# Patient Record
Sex: Male | Born: 1945 | Race: White | Hispanic: No | Marital: Married | Smoking: Former smoker
Health system: Southern US, Community
[De-identification: ages and names within clinical notes are randomized; demographics above are authoritative.]

## PROBLEM LIST (undated history)

## (undated) DIAGNOSIS — R002 Palpitations: Secondary | ICD-10-CM

## (undated) DIAGNOSIS — M1712 Unilateral primary osteoarthritis, left knee: Secondary | ICD-10-CM

## (undated) DIAGNOSIS — M25569 Pain in unspecified knee: Secondary | ICD-10-CM

## (undated) HISTORY — DX: Unilateral primary osteoarthritis, left knee: M17.12

## (undated) HISTORY — PX: HERNIA REPAIR: SHX51

## (undated) HISTORY — DX: Pain in unspecified knee: M25.569

## (undated) HISTORY — DX: Palpitations: R00.2

## (undated) HISTORY — PX: CATARACT EXTRACTION, BILATERAL: SHX1313

---

## 2020-05-23 ENCOUNTER — Emergency Department (HOSPITAL_BASED_OUTPATIENT_CLINIC_OR_DEPARTMENT_OTHER)
Admission: EM | Admit: 2020-05-23 | Discharge: 2020-05-23 | Disposition: A | Discharge status: Home / Self Care | Payer: Medicare Other | Attending: Emergency Medicine | Admitting: Emergency Medicine

## 2020-05-23 ENCOUNTER — Encounter (HOSPITAL_BASED_OUTPATIENT_CLINIC_OR_DEPARTMENT_OTHER): Payer: Self-pay | Admitting: Emergency Medicine

## 2020-05-23 ENCOUNTER — Emergency Department (HOSPITAL_BASED_OUTPATIENT_CLINIC_OR_DEPARTMENT_OTHER): Payer: Medicare Other

## 2020-05-23 ENCOUNTER — Other Ambulatory Visit: Payer: Self-pay

## 2020-05-23 DIAGNOSIS — R002 Palpitations: Secondary | ICD-10-CM | POA: Insufficient documentation

## 2020-05-23 LAB — CBC
HCT: 47.6 % (ref 39.0–52.0)
Hemoglobin: 16.7 g/dL (ref 13.0–17.0)
MCH: 31.6 pg (ref 26.0–34.0)
MCHC: 35.1 g/dL (ref 30.0–36.0)
MCV: 90 fL (ref 80.0–100.0)
Platelets: 196 10*3/uL (ref 150–400)
RBC: 5.29 MIL/uL (ref 4.22–5.81)
RDW: 12.5 % (ref 11.5–15.5)
WBC: 6.2 10*3/uL (ref 4.0–10.5)
nRBC: 0 % (ref 0.0–0.2)

## 2020-05-23 LAB — BASIC METABOLIC PANEL
Anion gap: 11 (ref 5–15)
BUN: 13 mg/dL (ref 8–23)
CO2: 24 mmol/L (ref 22–32)
Calcium: 9.6 mg/dL (ref 8.9–10.3)
Chloride: 104 mmol/L (ref 98–111)
Creatinine, Ser: 0.99 mg/dL (ref 0.61–1.24)
GFR, Estimated: >60 mL/min (ref >60)
Glucose, Bld: 127 mg/dL — ABNORMAL HIGH (ref 70–99)
Potassium: 3.7 mmol/L (ref 3.5–5.1)
Sodium: 139 mmol/L (ref 135–145)

## 2020-05-23 LAB — TSH: TSH: 1.819 u[IU]/mL (ref 0.350–4.500)

## 2020-05-23 LAB — TROPONIN I (HIGH SENSITIVITY): Troponin I (High Sensitivity): 6 ng/L (ref <18)

## 2020-05-23 NOTE — ED Triage Notes (Signed)
Pt woke up this am and had heart racing for in 160s according to monitor at home and took BP and it was 181/96. Denies HTN or cardiac disease. States it has resolved now. Denies chest pain during event, lasted 1 hour. Took 81 mg ASA during event.

## 2020-05-23 NOTE — ED Provider Notes (Signed)
[facility] EMERGENCY DEPARTMENT Provider Note   CSN: [id number] Arrival date & time: 05/23/20  1027     History Chief Complaint  Patient presents with   Palpitations    Zachary Barton is a 74 y.o. male.  HPI Patient presents with a racing heart.  States he woke up this morning to feel his heart going fast.  States it was around 150 260 beats a minute.  Lasted about an hour but is now resolved.  Took a baby aspirin.  No chest pain.  No lightheadedness.  Has been doing well the last few days.  Is not on any medicines.  Recently able to hike [employer].  No swelling in his legs.  No shortness of breath.  No stimulants.  No history of thyroid disease.    History reviewed. No pertinent past medical history.  There are no problems to display for this patient.   History reviewed. No pertinent surgical history.     No family history on file.  Social History   Tobacco Use   Smoking status: Never Smoker   Smokeless tobacco: Never Used  Substance Use Topics   Alcohol use: Never   Drug use: Never    Home Medications Prior to Admission medications   Medication Sig Start Date End Date Taking? Authorizing Provider  Glucosamine Sulfate 1000 MG TABS Take by mouth.    [provider]    Allergies    Patient has no known allergies.  Review of Systems   Review of Systems  Constitutional: Negative for appetite change.  HENT: Negative for congestion.   Respiratory: Negative for shortness of breath.   Cardiovascular: Positive for palpitations. Negative for leg swelling.  Gastrointestinal: Negative for abdominal pain.  Genitourinary: Negative for flank pain.  Musculoskeletal: Negative for back pain.  Skin: Negative for rash.  Neurological: Negative for weakness.  Psychiatric/Behavioral: Negative for confusion.    Physical Exam Updated Vital Signs BP (!) 184/101    Pulse 76    Temp 98.4 F (36.9 C)    Resp 16    Ht 5\' 10"  (1.778 m)    Wt 83.9  kg    SpO2 99%    BMI 26.54 kg/m   Physical Exam Vitals and nursing note reviewed.  HENT:     Head: Normocephalic.  Eyes:     Pupils: Pupils are equal, round, and reactive to light.  Cardiovascular:     Rate and Rhythm: Normal rate and regular rhythm.  Pulmonary:     Breath sounds: No wheezing, rhonchi or rales.  Abdominal:     Tenderness: There is no abdominal tenderness.  Musculoskeletal:        General: No tenderness.     Cervical back: Neck supple.     Right lower leg: No edema.     Left lower leg: No edema.  Skin:    General: Skin is warm.     Capillary Refill: Capillary refill takes less than 2 seconds.  Neurological:     Mental Status: He is alert and oriented to person, place, and time.     ED Results / Procedures / Treatments   Labs (all labs ordered are listed, but only abnormal results are displayed) Labs Reviewed  BASIC METABOLIC PANEL - Abnormal; Notable for the following components:      Result Value   Glucose, Bld 127 (*)    All other components within normal limits  CBC  TSH  TROPONIN I (HIGH SENSITIVITY)    EKG  EKG Interpretation  Date/Time:  Friday May 23 2020 10:42:41 EST Ventricular Rate:  97 PR Interval:    QRS Duration: 105 QT Interval:  363 QTC Calculation: 462 R Axis:   76 Text Interpretation: Sinus rhythm Borderline repolarization abnormality Confirmed by Benjiman Core [phone]) on 05/23/2020 10:44:05 AM   Radiology DG Chest 2 View  Result Date: 05/23/2020 CLINICAL DATA:  Chest pain and tachycardia this morning. EXAM: CHEST - 2 VIEW COMPARISON:  None. FINDINGS: The cardiac silhouette, mediastinal and hilar contours are normal. The lungs are clear. No pleural effusion. No pulmonary lesions. The bony thorax is intact. IMPRESSION: No acute cardiopulmonary findings. Electronically Signed   By: Rudie Meyer M.D.   On: 05/23/2020 11:15    Procedures Procedures (including critical care time)  Medications Ordered in  ED Medications - No data to display  ED Course  I have reviewed the triage vital signs and the nursing notes.  Pertinent labs & imaging results that were available during my care of the patient were reviewed by me and considered in my medical decision making (see chart for details).    MDM Rules/Calculators/A&P                          Patient woke this morning feeling his heart race.  Last about an hour.  Heart rates up to 150 160.  It has since resolved.  Lab work reassuring.  TSH done but still pending.  Blood pressure elevated here but states he does have whitecoat syndrome.  Does not have a PCP but will need to follow-up.  Also given resources for cardiology.  Discussed with patient and his wife about what to watch for in terms of persistently fast heart rate.  Patient's wife sees [facility] physicians.  He may follow with them or may follow with our providers.  Will discharge home. With a heart rate at around 150 potentially could be in atrial fibrillation but was not caught on monitoring.  Do not think he has criteria for anticoagulation at this point and his rate appears controlled as is his rhythm at this time. Final Clinical Impression(s) / ED Diagnoses Final diagnoses:  Palpitations    Rx / DC Orders ED Discharge Orders    None       Benjiman Core, MD 05/23/20 1158

## 2020-05-23 NOTE — Discharge Instructions (Addendum)
Follow up with cardiology and primary care doctor

## 2020-05-27 ENCOUNTER — Other Ambulatory Visit: Payer: Self-pay

## 2020-05-27 DIAGNOSIS — M25569 Pain in unspecified knee: Secondary | ICD-10-CM | POA: Insufficient documentation

## 2020-05-27 DIAGNOSIS — M1712 Unilateral primary osteoarthritis, left knee: Secondary | ICD-10-CM | POA: Insufficient documentation

## 2020-05-27 DIAGNOSIS — R002 Palpitations: Secondary | ICD-10-CM | POA: Insufficient documentation

## 2020-05-28 ENCOUNTER — Ambulatory Visit: Payer: Medicare Other | Admitting: Cardiology

## 2020-05-28 ENCOUNTER — Other Ambulatory Visit: Payer: Self-pay

## 2020-05-28 ENCOUNTER — Encounter: Payer: Self-pay | Admitting: Cardiology

## 2020-05-28 ENCOUNTER — Ambulatory Visit (INDEPENDENT_AMBULATORY_CARE_PROVIDER_SITE_OTHER): Payer: Medicare Other

## 2020-05-28 VITALS — BP 180/98 | HR 68 | Ht 70.0 in | Wt 189.0 lb

## 2020-05-28 DIAGNOSIS — R002 Palpitations: Secondary | ICD-10-CM

## 2020-05-28 DIAGNOSIS — I10 Essential (primary) hypertension: Secondary | ICD-10-CM

## 2020-05-28 DIAGNOSIS — R0600 Dyspnea, unspecified: Secondary | ICD-10-CM

## 2020-05-28 DIAGNOSIS — R0609 Other forms of dyspnea: Secondary | ICD-10-CM

## 2020-05-28 HISTORY — DX: Dyspnea, unspecified: R06.00

## 2020-05-28 HISTORY — DX: Other forms of dyspnea: R06.09

## 2020-05-28 HISTORY — DX: Essential (primary) hypertension: I10

## 2020-05-28 NOTE — Progress Notes (Signed)
Cardiology Consultation:    Date:  05/28/2020   ID:  Zachary Barton, DOB 1946-02-15, MRN [id number]  PCP:  Patient, No Pcp Per  Cardiologist:  Gypsy Balsam, MD   Referring MD: Benjiman Core, MD   Chief Complaint  Patient presents with  . Palpitations  . Hypertension    History of Present Illness:    Zachary Barton is a 74 y.o. male who is being seen today for the evaluation of I have palpitations at the request of Benjiman Core, MD.  Past medical history significant for questionable essential hypertension, palpitations.  He was sent to [place] to be evaluated for recent episode of palpitations.  He tells me that for about 40 years he experienced some most likely skipped beats.  He said that he can feel his stomach turns around.  However just few days ago he woke up in the middle of the night with heart speeding up between 1 50-1 80 did not have any dizziness that did not sweat did not have any shortness of breath did not have any chest pain he did not feel like he is going to pass out he took some aspirin eventually ended up going to the emergency room.  He showed up in the emergency about 1 hour later.  At that time he was found to be in sinus tachycardia he also have been noted to have elevated blood pressure.  He does not have diagnosis of hypertension but he does me anytime he goes to doctor's office and blood pressure will be elevated.  He does have blood pressure monitor at home but this is the wrist blood pressure monitor and he said when he check blood pressure usually 140 systolic.  Has never had any heart trouble.  Denies have any chest pain tightness squeezing pressure burning chest.  Still trying to be very active and exercise on the regular basis.  He exercise 4-5 times a week for about half an hour to 45 minutes does do some weightlifting as well as some walking with weights and he is ankles. Smoke long time ago for about 5 years when he was a teenager. Does not have  family history of premature coronary disease Does not know what his cholesterol is.  Past Medical History:  Diagnosis Date  . Knee pain   . Osteoarthritis of left patellofemoral joint   . Palpitations     Past Surgical History:  Procedure Laterality Date  . CATARACT EXTRACTION, BILATERAL  2years ago  . HERNIA REPAIR  25 years ago    Current Medications: Current Meds  Medication Sig  . aspirin 81 MG EC tablet Take 81 mg by mouth every other day.  . Glucosamine Sulfate 1000 MG TABS Take by mouth.     Allergies:   Patient has no known allergies.   Social History   Socioeconomic History  . Marital status: Married    Spouse name: Not on file  . Number of children: Not on file  . Years of education: Not on file  . Highest education level: Not on file  Occupational History  . Not on file  Tobacco Use  . Smoking status: Former Games developer  . Smokeless tobacco: Never Used  Substance and Sexual Activity  . Alcohol use: Never  . Drug use: Never  . Sexual activity: Not on file  Other Topics Concern  . Not on file  Social History Narrative  . Not on file   Social Determinants of Health   Financial Resource Strain:   .  Difficulty of Paying Living Expenses: Not on file  Food Insecurity:   . Worried About Programme researcher, broadcasting/film/video in the Last Year: Not on file  . Ran Out of Food in the Last Year: Not on file  Transportation Needs:   . Lack of Transportation (Medical): Not on file  . Lack of Transportation (Non-Medical): Not on file  Physical Activity:   . Days of Exercise per Week: Not on file  . Minutes of Exercise per Session: Not on file  Stress:   . Feeling of Stress : Not on file  Social Connections:   . Frequency of Communication with Friends and Family: Not on file  . Frequency of Social Gatherings with Friends and Family: Not on file  . Attends Religious Services: Not on file  . Active Member of Clubs or Organizations: Not on file  . Attends Banker  Meetings: Not on file  . Marital Status: Not on file     Family History: The patient's family history includes Atrial fibrillation in his brother; Cancer in his mother and paternal grandfather; Cancer - Other in his brother and father; Diabetes in his sister; Heart attack in his brother and maternal grandfather; Hypertension in his mother; Thyroid disease in his mother. ROS:   Please see the history of present illness.    All 14 point review of systems negative except as described per history of present illness.  EKGs/Labs/Other Studies Reviewed:    The following studies were reviewed today: EKG reviewed from the hospital showed normal sinus rhythm normal P interval nonspecific ST segment changes  EKG:  EKG is  ordered today.  The ekg ordered today demonstrates normal sinus rhythm normal P interval normal QS complex duration morphology nonspecific ST segment changes  Recent Labs: 05/23/2020: BUN 13; Creatinine, Ser 0.99; Hemoglobin 16.7; Platelets 196; Potassium 3.7; Sodium 139; TSH 1.819  Recent Lipid Panel No results found for: CHOL, TRIG, HDL, CHOLHDL, VLDL, LDLCALC, LDLDIRECT  Physical Exam:    VS:  BP (!) 180/98 (BP Location: Right Arm, Patient Position: Sitting)   Pulse 68   Ht 5\' 10"  (1.778 m)   Wt 189 lb (85.7 kg)   SpO2 97%   BMI 27.12 kg/m     Wt Readings from Last 3 Encounters:  05/28/20 189 lb (85.7 kg)  05/23/20 185 lb (83.9 kg)     GEN:  Well nourished, well developed in no acute distress HEENT: Normal NECK: No JVD; No carotid bruits LYMPHATICS: No lymphadenopathy CARDIAC: RRR, soft systolic murmur grade 1/6 best at right upper portion of the sternum, no rubs, no gallops RESPIRATORY:  Clear to auscultation without rales, wheezing or rhonchi  ABDOMEN: Soft, non-tender, non-distended MUSCULOSKELETAL:  No edema; No deformity  SKIN: Warm and dry NEUROLOGIC:  Alert and oriented x 3 PSYCHIATRIC:  Normal affect   ASSESSMENT:    1. Palpitations   2. Essential  hypertension   3. Dyspnea on exertion    PLAN:    In order of problems listed above:  1. Palpitations: I will ask him to wear Zio patch for a week hopefully will be able to identified what arrhythmia with dealing with.  Obviously concern is about potential atrial fibrillation.  I suspect he does have essential hypertension, also his wife tells me that he snores a lot sometimes stops breathing at night I suspect sleep apnea also is a diagnosis that he has.  All testing make him more like to have atrial fibrillation.  Luckily his chads 2  Vascor will be only 2.  But still if this is atrial fibrillation anticoagulation should be considered.  As a part of evaluation echocardiogram will be done to assess left ventricle ejection fraction left atrial size. 2. Essential hypertension ask him to purchase blood pressure monitor that he will check blood pressure on his arm, I asked him to check blood pressure on the regular basis once to twice a day and taken out of it.  I want you to bring it in about next 2 weeks or next time when I see him.  I suspect we will start treating for essential hypertension.  He does not have evidence of LVH on the EKG.  I will do echocardiogram to look for evidence of LVH on the echo. 3. Dyspnea on exertion: Echocardiogram will be done to check left ventricle ejection fraction as well as look at diastolic function. 4. Cholesterol status: Unknown.  We will schedule him to have fasting profile. 5. I suspect he does have sleep apnea I initiated conversation about potentially having sleep study done he is reluctant to consider this yet but this is discussion will continue. 6. Systolic heart murmur suspect aortic stenosis but only very soft.  We will schedule him to have an echocardiogram   Medication Adjustments/Labs and Tests Ordered: Current medicines are reviewed at length with the patient today.  Concerns regarding medicines are outlined above.  Orders Placed This Encounter    Procedures  . EKG 12-Lead   No orders of the defined types were placed in this encounter.   Signed, Georgeanna Lea, MD, [facility]. 05/28/2020 10:18 AM    [facility]

## 2020-05-28 NOTE — Patient Instructions (Signed)
Medication Instructions:  Your physician recommends that you continue on your current medications as directed. Please refer to the Current Medication list given to you today. *If you need a refill on your cardiac medications before your next appointment, please call your pharmacy*   Lab Work: Your physician recommends that you return for lab work today: lipid   If you have labs (blood work) drawn today and your tests are completely normal, you will receive your results only by: . MyChart Message (if you have MyChart) OR . A paper copy in the mail If you have any lab test that is abnormal or we need to change your treatment, we will call you to review the results.   Testing/Procedures: Your physician has requested that you have an echocardiogram. Echocardiography is a painless test that uses sound waves to create images of your heart. It provides your doctor with information about the size and shape of your heart and how well your heart's chambers and valves are working. This procedure takes approximately one hour. There are no restrictions for this procedure.  A zio monitor was ordered today. It will remain on for 7 days. You will then return monitor and event diary in provided box. It takes 1-2 weeks for report to be downloaded and returned to us. We will call you with the results. If monitor falls off or has orange flashing light, please call Zio for further instructions.      Follow-Up: At [facility], you and your health needs are our priority.  As part of our continuing mission to provide you with exceptional heart care, we have created designated Provider Care Teams.  These Care Teams include your primary Cardiologist (physician) and Advanced Practice Providers (APPs -  Physician Assistants and Nurse Practitioners) who all work together to provide you with the care you need, when you need it.  We recommend signing up for the patient portal called "MyChart".  Sign up information is  provided on this After Visit Summary.  MyChart is used to connect with patients for Virtual Visits (Telemedicine).  Patients are able to view lab/test results, encounter notes, upcoming appointments, etc.  Non-urgent messages can be sent to your provider as well.   To learn more about what you can do with MyChart, go to [email or web address].    Your next appointment:   6 week(s)  The format for your next appointment:   In Person  Provider:   Robert Krasowski, MD   Other Instructions   Echocardiogram An echocardiogram is a procedure that uses painless sound waves (ultrasound) to produce an image of the heart. Images from an echocardiogram can provide important information about:  Signs of coronary artery disease (CAD).  Aneurysm detection. An aneurysm is a weak or damaged part of an artery wall that bulges out from the normal force of blood pumping through the body.  Heart size and shape. Changes in the size or shape of the heart can be associated with certain conditions, including heart failure, aneurysm, and CAD.  Heart muscle function.  Heart valve function.  Signs of a past heart attack.  Fluid buildup around the heart.  Thickening of the heart muscle.  A tumor or infectious growth around the heart valves. Tell a health care provider about:  Any allergies you have.  All medicines you are taking, including vitamins, herbs, eye drops, creams, and over-the-counter medicines.  Any blood disorders you have.  Any surgeries you have had.  Any medical conditions you have.  Whether you   are pregnant or may be pregnant. What are the risks? Generally, this is a safe procedure. However, problems may occur, including:  Allergic reaction to dye (contrast) that may be used during the procedure. What happens before the procedure? No specific preparation is needed. You may eat and drink normally. What happens during the procedure?   An IV tube may be inserted into one of  your veins.  You may receive contrast through this tube. A contrast is an injection that improves the quality of the pictures from your heart.  A gel will be applied to your chest.  A wand-like tool (transducer) will be moved over your chest. The gel will help to transmit the sound waves from the transducer.  The sound waves will harmlessly bounce off of your heart to allow the heart images to be captured in real-time motion. The images will be recorded on a computer. The procedure may vary among health care providers and hospitals. What happens after the procedure?  You may return to your normal, everyday life, including diet, activities, and medicines, unless your health care provider tells you not to do that. Summary  An echocardiogram is a procedure that uses painless sound waves (ultrasound) to produce an image of the heart.  Images from an echocardiogram can provide important information about the size and shape of your heart, heart muscle function, heart valve function, and fluid buildup around your heart.  You do not need to do anything to prepare before this procedure. You may eat and drink normally.  After the echocardiogram is completed, you may return to your normal, everyday life, unless your health care provider tells you not to do that. This information is not intended to replace advice given to you by your health care provider. Make sure you discuss any questions you have with your health care provider. Document Revised: 10/19/2018 Document Reviewed: 07/31/2016 Elsevier Patient Education  2020 [employer].

## 2020-05-29 ENCOUNTER — Telehealth: Payer: Self-pay | Admitting: Emergency Medicine

## 2020-05-29 DIAGNOSIS — E7800 Pure hypercholesterolemia, unspecified: Secondary | ICD-10-CM

## 2020-05-29 LAB — LIPID PANEL
Chol/HDL Ratio: 5.3 ratio — ABNORMAL HIGH (ref 0.0–5.0)
Cholesterol, Total: 272 mg/dL — ABNORMAL HIGH (ref 100–199)
HDL: 51 mg/dL (ref >39)
LDL Chol Calc (NIH): 193 mg/dL — ABNORMAL HIGH (ref 0–99)
Triglycerides: 152 mg/dL — ABNORMAL HIGH (ref 0–149)
VLDL Cholesterol Cal: 28 mg/dL (ref 5–40)

## 2020-05-29 MED ORDER — ROSUVASTATIN CALCIUM 10 MG PO TABS: 10.0000 mg | ORAL_TABLET | Freq: Every day | ORAL | 1 refills | Status: DC

## 2020-05-29 NOTE — Telephone Encounter (Signed)
Called patient informed him of results. He will start crestor 10 mg daily and have labs rechecked in 6 weeks no further questions.

## 2020-05-29 NOTE — Telephone Encounter (Signed)
-----   Message from Georgeanna Lea, MD sent at 05/29/2020  8:16 AM EST ----- Cholesterol very high with LDL bad cholesterol 193,This is a level that need to be treated with medications. I would suggest start with Crestor 10 mg daily with fasting lipid profile to be repeated in about 6 weeks with AST ALT

## 2020-06-03 DIAGNOSIS — Z79899 Other long term (current) drug therapy: Secondary | ICD-10-CM

## 2020-06-03 MED ORDER — LOSARTAN POTASSIUM 50 MG PO TABS: 50.0000 mg | ORAL_TABLET | Freq: Every day | ORAL | 1 refills | Status: DC

## 2020-06-03 NOTE — Addendum Note (Signed)
Addended by: Hazle Quant on: 06/03/2020 04:32 PM   Modules accepted: Orders

## 2020-06-11 LAB — BASIC METABOLIC PANEL
BUN/Creatinine Ratio: 12 (ref 10–24)
BUN: 10 mg/dL (ref 8–27)
CO2: 25 mmol/L (ref 20–29)
Calcium: 9.2 mg/dL (ref 8.6–10.2)
Chloride: 104 mmol/L (ref 96–106)
Creatinine, Ser: 0.84 mg/dL (ref 0.76–1.27)
GFR calc Af Amer: 100 mL/min/{1.73_m2} (ref >59)
GFR calc non Af Amer: 87 mL/min/{1.73_m2} (ref >59)
Glucose: 104 mg/dL — ABNORMAL HIGH (ref 65–99)
Potassium: 4.2 mmol/L (ref 3.5–5.2)
Sodium: 143 mmol/L (ref 134–144)

## 2020-06-16 ENCOUNTER — Ambulatory Visit (HOSPITAL_BASED_OUTPATIENT_CLINIC_OR_DEPARTMENT_OTHER)
Admission: RE | Admit: 2020-06-16 | Discharge: 2020-06-16 | Disposition: A | Discharge status: Home / Self Care | Payer: Medicare Other | Source: Ambulatory Visit | Attending: Cardiology | Admitting: Cardiology

## 2020-06-16 ENCOUNTER — Other Ambulatory Visit: Payer: Self-pay

## 2020-06-16 DIAGNOSIS — R002 Palpitations: Secondary | ICD-10-CM | POA: Insufficient documentation

## 2020-06-16 DIAGNOSIS — R0609 Other forms of dyspnea: Secondary | ICD-10-CM

## 2020-06-16 DIAGNOSIS — R0600 Dyspnea, unspecified: Secondary | ICD-10-CM | POA: Diagnosis not present

## 2020-06-16 DIAGNOSIS — I10 Essential (primary) hypertension: Secondary | ICD-10-CM | POA: Diagnosis not present

## 2020-06-16 LAB — ECHOCARDIOGRAM COMPLETE
AR max vel: 3.14 cm2
AV Area VTI: 2.85 cm2
AV Area mean vel: 2.8 cm2
AV Mean grad: 7 mmHg
AV Peak grad: 10.5 mmHg
Ao pk vel: 1.62 m/s
Area-P 1/2: 3.99 cm2
S' Lateral: 2.82 cm

## 2020-06-17 ENCOUNTER — Telehealth: Payer: Self-pay | Admitting: Cardiology

## 2020-06-17 NOTE — Telephone Encounter (Signed)
Patient returning call.

## 2020-06-18 NOTE — Telephone Encounter (Signed)
Called patient informed him of results.  

## 2020-07-09 ENCOUNTER — Ambulatory Visit: Payer: Medicare Other | Admitting: Cardiology

## 2020-07-09 ENCOUNTER — Other Ambulatory Visit: Payer: Self-pay

## 2020-07-09 ENCOUNTER — Encounter: Payer: Self-pay | Admitting: Cardiology

## 2020-07-09 VITALS — BP 166/74 | HR 63 | Ht 70.0 in | Wt 183.0 lb

## 2020-07-09 DIAGNOSIS — I10 Essential (primary) hypertension: Secondary | ICD-10-CM | POA: Diagnosis not present

## 2020-07-09 DIAGNOSIS — R0609 Other forms of dyspnea: Secondary | ICD-10-CM

## 2020-07-09 DIAGNOSIS — E785 Hyperlipidemia, unspecified: Secondary | ICD-10-CM

## 2020-07-09 DIAGNOSIS — I471 Supraventricular tachycardia, unspecified: Secondary | ICD-10-CM

## 2020-07-09 DIAGNOSIS — R002 Palpitations: Secondary | ICD-10-CM | POA: Diagnosis not present

## 2020-07-09 DIAGNOSIS — R0600 Dyspnea, unspecified: Secondary | ICD-10-CM

## 2020-07-09 HISTORY — DX: Hyperlipidemia, unspecified: E78.5

## 2020-07-09 HISTORY — DX: Supraventricular tachycardia: I47.1

## 2020-07-09 HISTORY — DX: Supraventricular tachycardia, unspecified: I47.10

## 2020-07-09 MED ORDER — METOPROLOL SUCCINATE ER 50 MG PO TB24: 50.0000 mg | ORAL_TABLET | Freq: Every day | ORAL | 3 refills | Status: DC

## 2020-07-09 NOTE — Patient Instructions (Signed)
Medication Instructions:  Your physician has recommended you make the following change in your medication:  START: Metoprolol Succinate 50 mg take one tablet by mouth daily.  *If you need a refill on your cardiac medications before your next appointment, please call your pharmacy*   Lab Work: Your physician recommends that you return for lab work in: TODAY Lipids If you have labs (blood work) drawn today and your tests are completely normal, you will receive your results only by: Marland Kitchen MyChart Message (if you have MyChart) OR . A paper copy in the mail If you have any lab test that is abnormal or we need to change your treatment, we will call you to review the results.   Testing/Procedures: None   Follow-Up: At [facility], you and your health needs are our priority.  As part of our continuing mission to provide you with exceptional heart care, we have created designated Provider Care Teams.  These Care Teams include your primary Cardiologist (physician) and Advanced Practice Providers (APPs -  Physician Assistants and Nurse Practitioners) who all work together to provide you with the care you need, when you need it.  We recommend signing up for the patient portal called "MyChart".  Sign up information is provided on this After Visit Summary.  MyChart is used to connect with patients for Virtual Visits (Telemedicine).  Patients are able to view lab/test results, encounter notes, upcoming appointments, etc.  Non-urgent messages can be sent to your provider as well.   To learn more about what you can do with MyChart, go to [email or web address].    Your next appointment:   2 month(s)  The format for your next appointment:   In Person  Provider:   Gypsy Balsam, MD   Other Instructions

## 2020-07-09 NOTE — Progress Notes (Signed)
Cardiology Office Note:    Date:  07/09/2020   ID:  Zachary Barton, DOB [birth year], MRN [id number]  PCP:  Patient, No Pcp Per  Cardiologist:  Zachary Balsam, MD    Referring MD: No ref. provider found   Chief Complaint  Patient presents with  . Follow-up  Still have some palpitations  History of Present Illness:    Zachary Barton is a 74 y.o. male who was referred to [place] because of episode of palpitations that was happening on and off sometimes more sustained arrhythmia skipped beats.  Also he was complaining of having a dyspnea on exertion.  He is trying to be active he exercises 3 times a week for about half an hour to 45 minutes doing some walking as well as lifting weights.  Comes today 2 months of follow-up.  Overall still complain of having some palpitations.  Denies have any chest pain tightness squeezing pressure burning chest.  Last time I gave him statin he seems to be tolerating that statin quite well.  Past Medical History:  Diagnosis Date  . Dyspnea on exertion 05/28/2020  . Essential hypertension 05/28/2020  . Knee pain   . Osteoarthritis of left patellofemoral joint   . Palpitations     Past Surgical History:  Procedure Laterality Date  . CATARACT EXTRACTION, BILATERAL  2years ago  . HERNIA REPAIR  25 years ago    Current Medications: Current Meds  Medication Sig  . aspirin 81 MG EC tablet Take 81 mg by mouth every other day.  . Glucosamine Sulfate 1000 MG TABS Take by mouth.  . losartan (COZAAR) 50 MG tablet Take 1 tablet (50 mg total) by mouth daily.  . metoprolol succinate (TOPROL-XL) 50 MG 24 hr tablet Take 1 tablet (50 mg total) by mouth daily. Take with or immediately following a meal.  . rosuvastatin (CRESTOR) 10 MG tablet Take 1 tablet (10 mg total) by mouth daily.     Allergies:   Patient has no known allergies.   Social History   Socioeconomic History  . Marital status: Married    Spouse name: Not on file  . Number of children: Not on  file  . Years of education: Not on file  . Highest education level: Not on file  Occupational History  . Not on file  Tobacco Use  . Smoking status: Former Games developer  . Smokeless tobacco: Never Used  Substance and Sexual Activity  . Alcohol use: Never  . Drug use: Never  . Sexual activity: Not on file  Other Topics Concern  . Not on file  Social History Narrative  . Not on file   Social Determinants of Health   Financial Resource Strain: Not on file  Food Insecurity: Not on file  Transportation Needs: Not on file  Physical Activity: Not on file  Stress: Not on file  Social Connections: Not on file     Family History: The patient's family history includes Atrial fibrillation in his brother; Cancer in his mother and paternal grandfather; Cancer - Other in his brother and father; Diabetes in his sister; Heart attack in his brother and maternal grandfather; Hypertension in his mother; Thyroid disease in his mother. ROS:   Please see the history of present illness.    All 14 point review of systems negative except as described per history of present illness  EKGs/Labs/Other Studies Reviewed:      Recent Labs: 05/23/2020: Hemoglobin 16.7; Platelets 196; TSH 1.819 06/10/2020: BUN 10; Creatinine, Ser 0.84; Potassium 4.2;  Sodium 143  Recent Lipid Panel    Component Value Date/Time   CHOL 272 (H) 05/28/2020 1037   TRIG 152 (H) 05/28/2020 1037   HDL 51 05/28/2020 1037   CHOLHDL 5.3 (H) 05/28/2020 1037   LDLCALC 193 (H) 05/28/2020 1037    Physical Exam:    VS:  BP (!) 166/74 (BP Location: Right Arm, Patient Position: Sitting)   Pulse 63   Ht 5\' 10"  (1.778 m)   Wt 183 lb (83 kg)   SpO2 93%   BMI 26.26 kg/m     Wt Readings from Last 3 Encounters:  07/09/20 183 lb (83 kg)  05/28/20 189 lb (85.7 kg)  05/23/20 185 lb (83.9 kg)     GEN:  Well nourished, well developed in no acute distress HEENT: Normal NECK: No JVD; No carotid bruits LYMPHATICS: No  lymphadenopathy CARDIAC: RRR, no murmurs, no rubs, no gallops RESPIRATORY:  Clear to auscultation without rales, wheezing or rhonchi  ABDOMEN: Soft, non-tender, non-distended MUSCULOSKELETAL:  No edema; No deformity  SKIN: Warm and dry LOWER EXTREMITIES: no swelling NEUROLOGIC:  Alert and oriented x 3 PSYCHIATRIC:  Normal affect   ASSESSMENT:    1. Hyperlipidemia, unspecified hyperlipidemia type   2. Supraventricular tachycardia (HCC)   3. Essential hypertension   4. Palpitations   5. Dyspnea on exertion   6. Dyslipidemia    PLAN:    In order of problems listed above:  1. Supraventricular tachycardia this is a new diagnosis she had multiple episodes on monitor none of this was sustained.  We did talk about options for the situation I think it would be reasonable to initiate beta-blocker.  I will start him on 50 mg of metoprolol succinate.  I warned him about potential side effect of this medication and asked him to let me know if he develops those. 2. Essential hypertension blood pressure is better but still elevated.  Hopefully addition of beta-blocker with intention to treat his arrhythmia will also help with the blood pressure.  I asked to check blood pressure on the regular basis and let me know within the next few weeks.  I will see him back in my office in about 2 months to continue management of this problem. 3. Dyslipidemia.  I did review his K PN which show me his LDL of 193.  I did suggest possibility of familial hyperlipidemia.  I told him to make sure he will ask his family members to have cholesterol checked.  I initiated statin will check cholesterol today. 4. Dyspnea on exertion echocardiogram showed preserved left ventricle ejection fraction, overall looks good.  We will continue present management.  I encouraged him to be a little bit more active with exercises.   Medication Adjustments/Labs and Tests Ordered: Current medicines are reviewed at length with the patient  today.  Concerns regarding medicines are outlined above.  Orders Placed This Encounter  Procedures  . Lipid panel   Medication changes:  Meds ordered this encounter  Medications  . metoprolol succinate (TOPROL-XL) 50 MG 24 hr tablet    Sig: Take 1 tablet (50 mg total) by mouth daily. Take with or immediately following a meal.    Dispense:  90 tablet    Refill:  3    Signed, 13/12/21, MD, [facility] 07/09/2020 11:56 AM    [facility]

## 2020-07-10 ENCOUNTER — Telehealth: Payer: Self-pay

## 2020-07-10 LAB — LIPID PANEL
Chol/HDL Ratio: 2.6 ratio (ref 0.0–5.0)
Cholesterol, Total: 148 mg/dL (ref 100–199)
HDL: 57 mg/dL (ref >39)
LDL Chol Calc (NIH): 76 mg/dL (ref 0–99)
Triglycerides: 77 mg/dL (ref 0–149)
VLDL Cholesterol Cal: 15 mg/dL (ref 5–40)

## 2020-07-10 NOTE — Telephone Encounter (Signed)
-----   Message from Georgeanna Lea, MD sent at 07/10/2020  8:28 AM EST ----- Cholesterol much improved.  Continue present management

## 2020-07-10 NOTE — Telephone Encounter (Signed)
Patient notified of results and verbalized understanding.  

## 2020-07-16 DIAGNOSIS — Z79899 Other long term (current) drug therapy: Secondary | ICD-10-CM

## 2020-07-19 LAB — BASIC METABOLIC PANEL
BUN/Creatinine Ratio: 12 (ref 10–24)
BUN: 12 mg/dL (ref 8–27)
CO2: 24 mmol/L (ref 20–29)
Calcium: 9.5 mg/dL (ref 8.6–10.2)
Chloride: 104 mmol/L (ref 96–106)
Creatinine, Ser: 1.03 mg/dL (ref 0.76–1.27)
GFR calc Af Amer: 83 mL/min/{1.73_m2} (ref >59)
GFR calc non Af Amer: 72 mL/min/{1.73_m2} (ref >59)
Glucose: 97 mg/dL (ref 65–99)
Potassium: 4.3 mmol/L (ref 3.5–5.2)
Sodium: 143 mmol/L (ref 134–144)

## 2020-07-24 MED ORDER — LOSARTAN POTASSIUM 100 MG PO TABS: 100.0000 mg | ORAL_TABLET | Freq: Every day | ORAL | 1 refills | Status: DC

## 2020-07-24 NOTE — Addendum Note (Signed)
Addended by: Heywood Bene on: 07/24/2020 11:50 AM   Modules accepted: Orders

## 2020-07-24 NOTE — Telephone Encounter (Signed)
Losartan 100mg  qd sent to his pharmacy. Patient notified through My chart.

## 2020-08-28 ENCOUNTER — Other Ambulatory Visit: Payer: Self-pay

## 2020-08-28 MED ORDER — LOSARTAN POTASSIUM 100 MG PO TABS: 100.0000 mg | ORAL_TABLET | Freq: Every day | ORAL | 1 refills | Status: DC

## 2020-08-28 NOTE — Telephone Encounter (Signed)
Rx refill sent to pharmacy. 

## 2020-09-09 ENCOUNTER — Other Ambulatory Visit: Payer: Self-pay

## 2020-09-09 ENCOUNTER — Ambulatory Visit: Payer: Medicare Other | Admitting: Cardiology

## 2020-09-09 ENCOUNTER — Encounter: Payer: Self-pay | Admitting: Cardiology

## 2020-09-09 VITALS — BP 190/100 | HR 55 | Ht 70.0 in | Wt 183.0 lb

## 2020-09-09 DIAGNOSIS — R0600 Dyspnea, unspecified: Secondary | ICD-10-CM

## 2020-09-09 DIAGNOSIS — R002 Palpitations: Secondary | ICD-10-CM | POA: Diagnosis not present

## 2020-09-09 DIAGNOSIS — I471 Supraventricular tachycardia: Secondary | ICD-10-CM | POA: Diagnosis not present

## 2020-09-09 DIAGNOSIS — E785 Hyperlipidemia, unspecified: Secondary | ICD-10-CM

## 2020-09-09 DIAGNOSIS — R0609 Other forms of dyspnea: Secondary | ICD-10-CM

## 2020-09-09 DIAGNOSIS — I10 Essential (primary) hypertension: Secondary | ICD-10-CM | POA: Diagnosis not present

## 2020-09-09 MED ORDER — HYDROCHLOROTHIAZIDE 12.5 MG PO CAPS: 12.5000 mg | ORAL_CAPSULE | Freq: Every day | ORAL | 1 refills | Status: DC

## 2020-09-09 NOTE — Patient Instructions (Signed)
Medication Instructions:  Your physician has recommended you make the following change in your medication:   When we call and tell you start: hydrochlorothiaizde 12.5 mg daily  *If you need a refill on your cardiac medications before your next appointment, please call your pharmacy*   Lab Work: Your physician recommends that you return for lab work today: bmp   And in 1 week : bmp   If you have labs (blood work) drawn today and your tests are completely normal, you will receive your results only by: Marland Kitchen MyChart Message (if you have MyChart) OR . A paper copy in the mail If you have any lab test that is abnormal or we need to change your treatment, we will call you to review the results.   Testing/Procedures: None   Follow-Up: At [facility], you and your health needs are our priority.  As part of our continuing mission to provide you with exceptional heart care, we have created designated Provider Care Teams.  These Care Teams include your primary Cardiologist (physician) and Advanced Practice Providers (APPs -  Physician Assistants and Nurse Practitioners) who all work together to provide you with the care you need, when you need it.  We recommend signing up for the patient portal called "MyChart".  Sign up information is provided on this After Visit Summary.  MyChart is used to connect with patients for Virtual Visits (Telemedicine).  Patients are able to view lab/test results, encounter notes, upcoming appointments, etc.  Non-urgent messages can be sent to your provider as well.   To learn more about what you can do with MyChart, go to [email or web address].    Your next appointment:   2 month(s)  The format for your next appointment:   In Person  Provider:   Gypsy Balsam, MD   Other Instructions  Hydrochlorothiazide Capsules or Tablets What is this medicine? HYDROCHLOROTHIAZIDE (hye droe klor oh THYE a zide) is a diuretic. It helps you make more urine and to lose  salt and excess water from your body. It treats swelling from heart, kidney, or liver disease. It also treats high blood pressure. This medicine may be used for other purposes; ask your health care provider or pharmacist if you have questions. COMMON BRAND NAME(S): Esidrix, Ezide, HydroDIURIL, Microzide, Oretic, Zide What should I tell my health care provider before I take this medicine? They need to know if you have any of these conditions:  diabetes  gout  kidney disease  liver disease  lupus  pancreatitis  an unusual or allergic reaction to hydrochlorothiazide, sulfa drugs, other medicines, foods, dyes, or preservatives  pregnant or trying to get pregnant  breast-feeding How should I use this medicine? Take this medicine by mouth. Take it as directed on the prescription label at the same time every day. You can take it with or without food. If it upsets your stomach, take it with food. Keep taking it unless your health care provider tells you to stop. Talk to your health care provider about the use of this medicine in children. While it may be prescribed for children as young as newborns for selected conditions, precautions do apply. Overdosage: If you think you have taken too much of this medicine contact a poison control center or emergency room at once. NOTE: This medicine is only for you. Do not share this medicine with others. What if I miss a dose? If you miss a dose, take it as soon as you can. If it is almost time for  your next dose, take only that dose. Do not take double or extra doses. What may interact with this medicine?  cholestyramine  colestipol  digoxin  dofetilide  lithium  medicines for blood pressure  medicines for diabetes  medicines that relax muscles for surgery  other diuretics  steroid medicines like prednisone or cortisone This list may not describe all possible interactions. Give your health care provider a list of all the medicines,  herbs, non-prescription drugs, or dietary supplements you use. Also tell them if you smoke, drink alcohol, or use illegal drugs. Some items may interact with your medicine. What should I watch for while using this medicine? Visit your health care provider for regular check ups. Check your blood pressure as directed. Ask your health care provider what your blood pressure should be. Also, find out when you should contact him or her. Do not treat yourself for coughs, colds, or pain while you are using this medicine without asking your health care provider for advice. Some medicines may increase your blood pressure. You may get drowsy or dizzy. Do not drive, use machinery, or do anything that needs mental alertness until you know how this medicine affects you. Do not stand or sit up quickly, especially if you are an older patient. This reduces the risk of dizzy or fainting spells. Alcohol can make you more drowsy and dizzy. Avoid alcoholic drinks. Talk to your health care professional about your risk of skin cancer. You may be more at risk for skin cancer if you take this medicine. This medicine can make you more sensitive to the sun. Keep out of the sun. If you cannot avoid being in the sun, wear protective clothing and use sunscreen. Do not use sun lamps or tanning beds/booths. You may need to be on a special diet while taking this medicine. Ask your health care provider. Also, find out how many glasses of fluids you need to drink each day. Check with your health care provider if you get an attack of severe diarrhea, nausea and vomiting, or if you sweat a lot. The loss of too much body fluid can make it dangerous for you to take this medicine. This medicine may increase blood sugar. Ask your healthcare provider if changes in diet or medicines are needed if you have diabetes. What side effects may I notice from receiving this medicine? Side effects that you should report to your doctor or health care  professional as soon as possible:  allergic reactions (skin rash, itching or hives; swelling of the face, lips, or tongue)  gout (severe pain, redness, or swelling in joints like the big toe)  high blood sugar (increased hunger, thirst or urination; unusually weak or tired; blurry vision)  kidney injury (trouble passing urine or change in the amount of urine)  low blood pressure (dizziness; feeling faint or lightheaded, falls; unusually weak or tired)  low potassium levels (trouble breathing; chest pain; dizziness; fast, irregular heartbeat; feeling faint or lightheaded, falls; muscle cramps or pain)  sudden change in vision or eye pain Side effects that usually do not require medical attention (report to your doctor or health care professional if they continue or are bothersome):  change in sex drive or performance  dry mouth  headache  stomach upset This list may not describe all possible side effects. Call your doctor for medical advice about side effects. You may report side effects to FDA at 1-800-FDA-1088. Where should I keep my medicine? Keep out of the reach of children  and pets. Store at room temperature between 20 and 25 degrees C (68 and 77 degrees F). Protect from light and moisture. Keep the container tightly closed. Do not freeze. Get rid of any unused medicine after the expiration date. To get rid of medicines that are no longer needed or have expired:  Take the medicine to a medicine take-back program. Check with your pharmacy or law enforcement to find a location.  If you cannot return the medicine, check the label or package insert to see if the medicine should be thrown out in the garbage or flushed down the toilet. If you are not sure, ask your health care provider. If it is safe to put in the trash, empty the medicine out of the container. Mix the medicine with cat litter, dirt, coffee grounds, or other unwanted substance. Seal the mixture in a bag or container.  Put it in the trash. NOTE: This sheet is a summary. It may not cover all possible information. If you have questions about this medicine, talk to your doctor, pharmacist, or health care provider.  2021 Elsevier/Gold Standard (2020-05-07 17:16:00)

## 2020-09-09 NOTE — Progress Notes (Signed)
Cardiology Office Note:    Date:  09/09/2020   ID:  Zachary Barton, DOB 1946-06-17, MRN [id number]  PCP:  Patient, No Pcp Per  Cardiologist:  Gypsy Balsam, MD    Referring MD: No ref. provider found   Chief Complaint  Patient presents with  . Follow-up  .  Much better blood having difficulty with my high blood pressure  History of Present Illness:    Zachary Barton is a 75 y.o. male who was sent initially to [place] because of palpitations.  He did wear monitor which showed supraventricular tachycardia, he was given metoprolol succinate 50 mg daily with suppression of the arrhythmia comes today 2 months of follow-up and discuss issue of his high blood pressure which is difficult to control.  He takes ready to medication that reduce blood pressure which include Cozaar as well as Toprol-XL in spite of that his blood pressure is still elevated.  He blames it on some stress.  Apparently tomorrow he is driving to [place] to funeral of his nephew who got killed in snowmobile accident.  Obviously he is shaken by that.  Denies have any chest pain tightness squeezing pressure burning chest.  Past Medical History:  Diagnosis Date  . Dyslipidemia 07/09/2020  . Dyspnea on exertion 05/28/2020  . Essential hypertension 05/28/2020  . Knee pain   . Osteoarthritis of left patellofemoral joint   . Palpitations   . Supraventricular tachycardia (HCC) 07/09/2020    Past Surgical History:  Procedure Laterality Date  . CATARACT EXTRACTION, BILATERAL  2years ago  . HERNIA REPAIR  25 years ago    Current Medications: Current Meds  Medication Sig  . aspirin 81 MG EC tablet Take 81 mg by mouth every other day.  . Glucosamine Sulfate 1000 MG TABS Take by mouth.  . losartan (COZAAR) 100 MG tablet Take 1 tablet (100 mg total) by mouth daily.  . metoprolol succinate (TOPROL-XL) 50 MG 24 hr tablet Take 1 tablet (50 mg total) by mouth daily. Take with or immediately following a meal.  . rosuvastatin  (CRESTOR) 10 MG tablet Take 1 tablet (10 mg total) by mouth daily.     Allergies:   Patient has no known allergies.   Social History   Socioeconomic History  . Marital status: Married    Spouse name: Not on file  . Number of children: Not on file  . Years of education: Not on file  . Highest education level: Not on file  Occupational History  . Not on file  Tobacco Use  . Smoking status: Former Games developer  . Smokeless tobacco: Never Used  Substance and Sexual Activity  . Alcohol use: Never  . Drug use: Never  . Sexual activity: Not on file  Other Topics Concern  . Not on file  Social History Narrative  . Not on file   Social Determinants of Health   Financial Resource Strain: Not on file  Food Insecurity: Not on file  Transportation Needs: Not on file  Physical Activity: Not on file  Stress: Not on file  Social Connections: Not on file     Family History: The patient's family history includes Atrial fibrillation in his brother; Cancer in his mother and paternal grandfather; Cancer - Other in his brother and father; Diabetes in his sister; Heart attack in his brother and maternal grandfather; Hypertension in his mother; Thyroid disease in his mother. ROS:   Please see the history of present illness.    All 14 point review of systems  negative except as described per history of present illness  EKGs/Labs/Other Studies Reviewed:      Recent Labs: 05/23/2020: Hemoglobin 16.7; Platelets 196; TSH 1.819 07/18/2020: BUN 12; Creatinine, Ser 1.03; Potassium 4.3; Sodium 143  Recent Lipid Panel    Component Value Date/Time   CHOL 148 07/09/2020 1155   TRIG 77 07/09/2020 1155   HDL 57 07/09/2020 1155   CHOLHDL 2.6 07/09/2020 1155   LDLCALC 76 07/09/2020 1155    Physical Exam:    VS:  BP (!) 190/100   Pulse (!) 55   Ht 5\' 10"  (1.778 m)   Wt 183 lb (83 kg)   SpO2 97%   BMI 26.26 kg/m     Wt Readings from Last 3 Encounters:  09/09/20 183 lb (83 kg)  07/09/20 183 lb  (83 kg)  05/28/20 189 lb (85.7 kg)     GEN:  Well nourished, well developed in no acute distress HEENT: Normal NECK: No JVD; No carotid bruits LYMPHATICS: No lymphadenopathy CARDIAC: RRR, no murmurs, no rubs, no gallops RESPIRATORY:  Clear to auscultation without rales, wheezing or rhonchi  ABDOMEN: Soft, non-tender, non-distended MUSCULOSKELETAL:  No edema; No deformity  SKIN: Warm and dry LOWER EXTREMITIES: no swelling NEUROLOGIC:  Alert and oriented x 3 PSYCHIATRIC:  Normal affect   ASSESSMENT:    1. Essential hypertension   2. Supraventricular tachycardia (HCC)   3. Palpitations   4. Dyspnea on exertion   5. Dyslipidemia    PLAN:    In order of problems listed above:  1. Essential hypertension poorly controlled, I will ask him to have Chem-7 done today if Chem-7 is fine will start with hydrochlorothiazide.  I anticipate also in the future to need to add some additional medication probably calcium channel blocker like Norvasc will be reasonable.  I will schedule him also to have ultrasounds of his kidney make sure he does not have significant renal artery stenosis. 2. Palpitations does not successfully suppressed with beta-blocker which I will continue. 3. Dyspnea on exertion improving continue monitoring, echocardiogram showed preserved ejection fraction. 4. Dyslipidemia I initiated Crestor and he is cholesterol much improved with LDL of 76 and HDL 57 this is from K PN from 20 06/19/2020. 5. Today we will do Chem-7 if Chem-7 is fine will initiate hydrochlorothiazide   Medication Adjustments/Labs and Tests Ordered: Current medicines are reviewed at length with the patient today.  Concerns regarding medicines are outlined above.  No orders of the defined types were placed in this encounter.  Medication changes: No orders of the defined types were placed in this encounter.   Signed, 14/03/2020, MD, [facility] 09/09/2020 11:08 AM    [facility]

## 2020-09-09 NOTE — Addendum Note (Signed)
Addended by: Hazle Quant on: 09/09/2020 11:14 AM   Modules accepted: Orders

## 2020-09-10 ENCOUNTER — Telehealth: Payer: Self-pay | Admitting: Emergency Medicine

## 2020-09-10 DIAGNOSIS — Z79899 Other long term (current) drug therapy: Secondary | ICD-10-CM

## 2020-09-10 LAB — BASIC METABOLIC PANEL
BUN/Creatinine Ratio: 11 (ref 10–24)
BUN: 10 mg/dL (ref 8–27)
CO2: 24 mmol/L (ref 20–29)
Calcium: 9.3 mg/dL (ref 8.6–10.2)
Chloride: 101 mmol/L (ref 96–106)
Creatinine, Ser: 0.95 mg/dL (ref 0.76–1.27)
Glucose: 108 mg/dL — ABNORMAL HIGH (ref 65–99)
Potassium: 4 mmol/L (ref 3.5–5.2)
Sodium: 142 mmol/L (ref 134–144)
eGFR: 85 mL/min/{1.73_m2} (ref >59)

## 2020-09-10 NOTE — Telephone Encounter (Signed)
-----   Message from Georgeanna Lea, MD sent at 09/10/2020 12:09 PM EST ----- Kidney function normal, potassium normal please start hydrochlorothiazide 12.5 daily, he will need to have Chem-7 done within a week

## 2020-09-19 LAB — BASIC METABOLIC PANEL
BUN/Creatinine Ratio: 13 (ref 10–24)
BUN: 11 mg/dL (ref 8–27)
CO2: 24 mmol/L (ref 20–29)
Calcium: 9.3 mg/dL (ref 8.6–10.2)
Chloride: 101 mmol/L (ref 96–106)
Creatinine, Ser: 0.87 mg/dL (ref 0.76–1.27)
Glucose: 102 mg/dL — ABNORMAL HIGH (ref 65–99)
Potassium: 3.9 mmol/L (ref 3.5–5.2)
Sodium: 143 mmol/L (ref 134–144)
eGFR: 91 mL/min/{1.73_m2} (ref >59)

## 2020-09-24 ENCOUNTER — Other Ambulatory Visit: Payer: Self-pay | Admitting: Cardiology

## 2020-11-12 ENCOUNTER — Encounter: Payer: Self-pay | Admitting: Cardiology

## 2020-11-12 ENCOUNTER — Ambulatory Visit: Payer: Medicare Other | Admitting: Cardiology

## 2020-11-12 ENCOUNTER — Other Ambulatory Visit: Payer: Self-pay

## 2020-11-12 VITALS — BP 190/96 | HR 85 | Ht 70.0 in | Wt 180.0 lb

## 2020-11-12 DIAGNOSIS — I471 Supraventricular tachycardia: Secondary | ICD-10-CM | POA: Diagnosis not present

## 2020-11-12 DIAGNOSIS — R0600 Dyspnea, unspecified: Secondary | ICD-10-CM | POA: Diagnosis not present

## 2020-11-12 DIAGNOSIS — E785 Hyperlipidemia, unspecified: Secondary | ICD-10-CM | POA: Diagnosis not present

## 2020-11-12 DIAGNOSIS — I10 Essential (primary) hypertension: Secondary | ICD-10-CM | POA: Diagnosis not present

## 2020-11-12 DIAGNOSIS — R0609 Other forms of dyspnea: Secondary | ICD-10-CM

## 2020-11-12 MED ORDER — SPIRONOLACTONE 25 MG PO TABS: 25.0000 mg | ORAL_TABLET | Freq: Every day | ORAL | 1 refills | Status: DC

## 2020-11-12 NOTE — Progress Notes (Signed)
Cardiology Office Note:    Date:  11/12/2020   ID:  Zachary Barton, DOB 04-Jun-1946, MRN [id number]  PCP:  Patient, No Pcp Per (Inactive)  Cardiologist:  Gypsy Balsam, MD    Referring MD: No ref. provider found   Chief Complaint  Patient presents with  . Follow-up    History of Present Illness:    Zachary Barton is a 75 y.o. male who was sent initially to our office because of palpitations monitor revealed supraventricular tachycardia that seems to be successfully suppressed with metoprolol succinate 50 mg daily, however issue is her blood pressure being difficult to control.  He is on ARB, beta-blocker as well as diuretic in spite of that still elevated.  Last time when I seen him he blames this on difficult situations of the family.  Apparently his nephew Killed in snowmobile accident he was very shaken by that but today he comes in still blood pressure is poorly controlled.  He tells me that he check his blood pressure at home usually see similar numbers.  Denies have any chest pain tightness squeezing pressure burning chest  Past Medical History:  Diagnosis Date  . Dyslipidemia 07/09/2020  . Dyspnea on exertion 05/28/2020  . Essential hypertension 05/28/2020  . Knee pain   . Osteoarthritis of left patellofemoral joint   . Palpitations   . Supraventricular tachycardia (HCC) 07/09/2020    Past Surgical History:  Procedure Laterality Date  . CATARACT EXTRACTION, BILATERAL  2years ago  . HERNIA REPAIR  25 years ago    Current Medications: Current Meds  Medication Sig  . aspirin 81 MG EC tablet Take 81 mg by mouth every other day.  . Glucosamine Sulfate 1000 MG TABS Take 1,000 mg by mouth daily.  . hydrochlorothiazide (MICROZIDE) 12.5 MG capsule Take 1 capsule (12.5 mg total) by mouth daily.  Marland Kitchen losartan (COZAAR) 100 MG tablet TAKE 1 TABLET(100 MG) BY MOUTH DAILY (Patient taking differently: Take 100 mg by mouth daily.)  . metoprolol succinate (TOPROL-XL) 50 MG 24 hr  tablet Take 1 tablet (50 mg total) by mouth daily. Take with or immediately following a meal.  . rosuvastatin (CRESTOR) 10 MG tablet Take 1 tablet (10 mg total) by mouth daily.     Allergies:   Patient has no known allergies.   Social History   Socioeconomic History  . Marital status: Married    Spouse name: Not on file  . Number of children: Not on file  . Years of education: Not on file  . Highest education level: Not on file  Occupational History  . Not on file  Tobacco Use  . Smoking status: Former Games developer  . Smokeless tobacco: Never Used  Substance and Sexual Activity  . Alcohol use: Never  . Drug use: Never  . Sexual activity: Not on file  Other Topics Concern  . Not on file  Social History Narrative  . Not on file   Social Determinants of Health   Financial Resource Strain: Not on file  Food Insecurity: Not on file  Transportation Needs: Not on file  Physical Activity: Not on file  Stress: Not on file  Social Connections: Not on file     Family History: The patient's family history includes Atrial fibrillation in his brother; Cancer in his mother and paternal grandfather; Cancer - Other in his brother and father; Diabetes in his sister; Heart attack in his brother and maternal grandfather; Hypertension in his mother; Thyroid disease in his mother. ROS:   Please  see the history of present illness.    All 14 point review of systems negative except as described per history of present illness  EKGs/Labs/Other Studies Reviewed:      Recent Labs: 05/23/2020: Hemoglobin 16.7; Platelets 196; TSH 1.819 09/18/2020: BUN 11; Creatinine, Ser 0.87; Potassium 3.9; Sodium 143  Recent Lipid Panel    Component Value Date/Time   CHOL 148 07/09/2020 1155   TRIG 77 07/09/2020 1155   HDL 57 07/09/2020 1155   CHOLHDL 2.6 07/09/2020 1155   LDLCALC 76 07/09/2020 1155    Physical Exam:    VS:  BP (!) 190/96 (BP Location: Right Arm, Patient Position: Sitting)   Pulse 85    Ht 5\' 10"  (1.778 m)   Wt 180 lb (81.6 kg)   SpO2 95%   BMI 25.83 kg/m     Wt Readings from Last 3 Encounters:  11/12/20 180 lb (81.6 kg)  09/09/20 183 lb (83 kg)  07/09/20 183 lb (83 kg)     GEN:  Well nourished, well developed in no acute distress HEENT: Normal NECK: No JVD; No carotid bruits LYMPHATICS: No lymphadenopathy CARDIAC: RRR, no murmurs, no rubs, no gallops RESPIRATORY:  Clear to auscultation without rales, wheezing or rhonchi  ABDOMEN: Soft, non-tender, non-distended MUSCULOSKELETAL:  No edema; No deformity  SKIN: Warm and dry LOWER EXTREMITIES: no swelling NEUROLOGIC:  Alert and oriented x 3 PSYCHIATRIC:  Normal affect   ASSESSMENT:    1. Supraventricular tachycardia (HCC)   2. Essential hypertension   3. Dyslipidemia   4. Dyspnea on exertion    PLAN:    In order of problems listed above:  1. Supraventricular tachycardia suppress SVT suppressed with beta-blocker which I will continue. 2. Essential hypertension: Still uncontrolled.  I will add Aldactone 25 daily to his medical regimen, will check Chem-7 on Monday.  I will schedule him to have renal ultrasounds to make sure he does not have any renal artery stenosis.  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 3. Dyslipidemia, he is taking Crestor 10 which I will continue.  I did review his K PN which show me his LDL of 76 HDL 57 this is from 09 July 2020.  We will continue present management. 4. We did talk about nonpharmacological well to help with the blood pressure which is avoiding salty food.  Exercises on the regular basis as well as some weight loss.   Medication Adjustments/Labs and Tests Ordered: Current medicines are reviewed at length with the patient today.  Concerns regarding medicines are outlined above.  No orders of the defined types were placed in this encounter.  Medication changes: No orders of the defined types were placed in this encounter.   Signed, 11 July 2020, MD, [facility] 11/12/2020 10:45 AM    [facility]

## 2020-11-12 NOTE — Addendum Note (Signed)
Addended by: Hazle Quant on: 11/12/2020 10:53 AM   Modules accepted: Orders

## 2020-11-12 NOTE — Patient Instructions (Signed)
Medication Instructions:  Your physician has recommended you make the following change in your medication:   START: Aldactone 25 mg daily   *If you need a refill on your cardiac medications before your next appointment, please call your pharmacy*   Lab Work: Your physician recommends that you return for lab work on Monday: BMP   If you have labs (blood work) drawn today and your tests are completely normal, you will receive your results only by: Marland Kitchen MyChart Message (if you have MyChart) OR . A paper copy in the mail If you have any lab test that is abnormal or we need to change your treatment, we will call you to review the results.   Testing/Procedures: Your physician has requested that you have a renal artery duplex. During this test, an ultrasound is used to evaluate blood flow to the kidneys. Allow one hour for this exam. Do not eat after midnight the day before and avoid carbonated beverages. Take your medications as you usually do.     Follow-Up: At [facility], you and your health needs are our priority.  As part of our continuing mission to provide you with exceptional heart care, we have created designated Provider Care Teams.  These Care Teams include your primary Cardiologist (physician) and Advanced Practice Providers (APPs -  Physician Assistants and Nurse Practitioners) who all work together to provide you with the care you need, when you need it.  We recommend signing up for the patient portal called "MyChart".  Sign up information is provided on this After Visit Summary.  MyChart is used to connect with patients for Virtual Visits (Telemedicine).  Patients are able to view lab/test results, encounter notes, upcoming appointments, etc.  Non-urgent messages can be sent to your provider as well.   To learn more about what you can do with MyChart, go to [email or web address].    Your next appointment:   1 month(s)  The format for your next appointment:   In  Person  Provider:   Gypsy Balsam, MD   Other Instructions  Spironolactone Oral Tablets What is this medicine? SPIRONOLACTONE (speer on oh LAK tone) is a diuretic. It helps you make more urine and to lose salt and excess water from your body. It treats swelling from heart, kidney, or liver disease. It treats high blood pressure. It also treats high aldosterone levels in the blood. This medicine may be used for other purposes; ask your health care provider or pharmacist if you have questions. COMMON BRAND NAME(S): Aldactone What should I tell my health care provider before I take this medicine? They need to know if you have any of these conditions:  Addison's disease or low adrenal gland function  high blood level of potassium  kidney disease  liver disease  an unusual or allergic reaction to spironolactone, other medicines, foods, dyes, or preservatives  pregnant or trying to get pregnant  breast-feeding How should I use this medicine? Take this medicine by mouth. Take it as directed on the prescription label at the same time every day. You can take it with or without food. You should always take it the same way. Keep taking it unless your health care provider tells you to stop. Talk to your health care provider about the use of this drug in children. Special care may be needed. Overdosage: If you think you have taken too much of this medicine contact a poison control center or emergency room at once. NOTE: This medicine is only for you. Do  not share this medicine with others. What if I miss a dose? If you miss a dose, take it as soon as you can. If it is almost time for your next dose, take only that dose. Do not take double or extra doses. What may interact with this medicine? Do not take this medicine with any of the following medications:  cidofovir  eplerenone  tranylcypromine This medicine may also interact with the following medications:  aspirin  certain  medicines for blood pressure or heart disease like benazepril, lisinopril, losartan, valsartan  certain medicines that treat or prevent blood clots like heparin and enoxaparin  cholestyramine  cyclosporine  digoxin  lithium  medicines that relax muscles for surgery  NSAIDs, medicines for pain and inflammation, like ibuprofen or naproxen  other diuretics  potassium salts or supplements  steroid medicines like prednisone or cortisone  trimethoprim This list may not describe all possible interactions. Give your health care provider a list of all the medicines, herbs, non-prescription drugs, or dietary supplements you use. Also tell them if you smoke, drink alcohol, or use illegal drugs. Some items may interact with your medicine. What should I watch for while using this medicine? Visit your doctor or health care provider for regular checks on your progress. Check your blood pressure as directed. Ask your health care provider what your blood pressure should be. Also, find out when you should contact him or her. Do not treat yourself for coughs, colds, or pain while you are using this medicine without asking your health care provider for advice. Some medicines may increase your blood pressure. Check with your health care provider if you have severe diarrhea, nausea, and vomiting, or if you sweat a lot. The loss of too much body fluid may make it dangerous for you to take this medicine. You may need to be on a special diet while taking this medicine. Ask your health care provider. Also, find out how many glasses of fluid you need to drink each day. You may get drowsy or dizzy. Do not drive, use machinery, or do anything that needs mental alertness until you know how this medicine affects you. Do not stand or sit up quickly, especially if you are an older patient. This reduces the risk of dizzy or fainting spells. Alcohol may interfere with the effects of this medicine. Avoid alcoholic  drinks. Avoid salt substitutes unless you are told otherwise by your health care provider. What side effects may I notice from receiving this medicine? Side effects that you should report to your health care provider as soon as possible:  allergic reactions (skin rash, itching or hives, swelling of the face, lips, or tongue)  breast enlargement in both males and females  changes in menstrual cycle  gout (severe pain, redness, or swelling in joints like the big toe)  high blood sugar (increased hunger, thirst or urination; unusually weak or tired, blurry vision)  high potassium levels (chest pain; fast irregular heartbeat; muscle weakness)  kidney injury (trouble passing urine or change in the amount of urine)  low blood pressure (dizziness; feeling faint or lightheaded, falls; unusually weak or tired)  low calcium levels (fast heartbeat; muscle cramps or pain; pain, tingling, or numbness in the hands or feet; seizures)  low magnesium levels (fast, irregular heartbeat; feeling faint or lightheaded, falls; muscle cramps or pain) Side effects that usually do not require medical attention (report to your health care provider if they continue or are bothersome):  changes in sex drive or  performance  dizziness  headache  upset stomach This list may not describe all possible side effects. Call your doctor for medical advice about side effects. You may report side effects to FDA at 1-800-FDA-1088. Where should I keep my medicine? Keep out of the reach of children and pets. Store below 25 degrees C (77 degrees F). Get rid of any unused medicine after the expiration date. To get rid of medicines that are no longer needed or have expired:  Take the medicine to a medicine take-back program. Check with your pharmacy or law enforcement to find a location.  If you cannot return the medicine, check the label or package insert to see if the medicine should be thrown out in the garbage or  flushed down the toilet. If you are not sure, ask your health care provider. If it is safe to put into the trash, take the medicine out of the container. Mix the medicine with cat litter, dirt, coffee grounds, or other unwanted substance. Seal the mixture in a bag or container. Put it in the trash. NOTE: This sheet is a summary. It may not cover all possible information. If you have questions about this medicine, talk to your doctor, pharmacist, or health care provider.  2021 Elsevier/Gold Standard (2019-09-14 19:57:34)

## 2020-11-18 ENCOUNTER — Other Ambulatory Visit (HOSPITAL_COMMUNITY): Payer: Self-pay | Admitting: Surgery

## 2020-11-18 LAB — BASIC METABOLIC PANEL
BUN/Creatinine Ratio: 12 (ref 10–24)
BUN: 12 mg/dL (ref 8–27)
CO2: 24 mmol/L (ref 20–29)
Calcium: 9.6 mg/dL (ref 8.6–10.2)
Chloride: 102 mmol/L (ref 96–106)
Creatinine, Ser: 0.98 mg/dL (ref 0.76–1.27)
Glucose: 105 mg/dL — ABNORMAL HIGH (ref 65–99)
Potassium: 4 mmol/L (ref 3.5–5.2)
Sodium: 143 mmol/L (ref 134–144)
eGFR: 81 mL/min/{1.73_m2} (ref >59)

## 2020-11-19 ENCOUNTER — Other Ambulatory Visit: Payer: Self-pay

## 2020-11-19 ENCOUNTER — Ambulatory Visit (HOSPITAL_COMMUNITY)
Admission: RE | Admit: 2020-11-19 | Discharge: 2020-11-19 | Disposition: A | Discharge status: Home / Self Care | Payer: Medicare Other | Source: Ambulatory Visit | Attending: Cardiology | Admitting: Cardiology

## 2020-11-19 DIAGNOSIS — R0609 Other forms of dyspnea: Secondary | ICD-10-CM

## 2020-11-19 DIAGNOSIS — I471 Supraventricular tachycardia: Secondary | ICD-10-CM | POA: Insufficient documentation

## 2020-11-19 DIAGNOSIS — E785 Hyperlipidemia, unspecified: Secondary | ICD-10-CM | POA: Diagnosis present

## 2020-11-19 DIAGNOSIS — I10 Essential (primary) hypertension: Secondary | ICD-10-CM | POA: Diagnosis present

## 2020-11-19 DIAGNOSIS — R0600 Dyspnea, unspecified: Secondary | ICD-10-CM | POA: Diagnosis present

## 2020-11-20 ENCOUNTER — Other Ambulatory Visit: Payer: Self-pay | Admitting: Cardiology

## 2020-11-27 ENCOUNTER — Other Ambulatory Visit: Payer: Self-pay | Admitting: Cardiology

## 2020-12-17 ENCOUNTER — Ambulatory Visit: Payer: Medicare Other | Admitting: Cardiology

## 2020-12-17 ENCOUNTER — Other Ambulatory Visit: Payer: Self-pay

## 2020-12-17 ENCOUNTER — Encounter: Payer: Self-pay | Admitting: Cardiology

## 2020-12-17 VITALS — BP 156/84 | HR 60 | Ht 70.0 in | Wt 180.0 lb

## 2020-12-17 DIAGNOSIS — I471 Supraventricular tachycardia: Secondary | ICD-10-CM | POA: Diagnosis not present

## 2020-12-17 DIAGNOSIS — I10 Essential (primary) hypertension: Secondary | ICD-10-CM

## 2020-12-17 DIAGNOSIS — E785 Hyperlipidemia, unspecified: Secondary | ICD-10-CM

## 2020-12-17 DIAGNOSIS — R002 Palpitations: Secondary | ICD-10-CM | POA: Diagnosis not present

## 2020-12-17 MED ORDER — MINOXIDIL 2.5 MG PO TABS: 2.5000 mg | ORAL_TABLET | Freq: Every day | ORAL | 2 refills | Status: DC

## 2020-12-17 NOTE — Addendum Note (Signed)
Addended by: Hazle Quant on: 12/17/2020 11:52 AM   Modules accepted: Orders

## 2020-12-17 NOTE — Progress Notes (Signed)
Cardiology Office Note:    Date:  12/17/2020   ID:  Zachary Barton, DOB August 20, 1945, MRN [id number]  PCP:  Patient, No Pcp Per (Inactive)  Cardiologist:  Gypsy Balsam, MD    Referring MD: No ref. provider found   Chief Complaint  Patient presents with  . Dizziness    History of Present Illness:    Zachary Barton is a 75 y.o. male with past medical history significant for supraventricular tachycardia which is successfully suppressed, essential hypertension actually multidrug resistant hypertension which is still somewhat difficult to control.  He comes today 2 months for follow-up he complaining of having some dizziness when he gets up very quickly he is blood pressure is still not well controlled and I think we reached a point that minoxidil should be considered.  However he is reluctant to add another medication, therefore, I will stop his Aldactone and will add minoxidil  Start with 2,5 mg daily.  Within the next week he will have Chem-7 done and then we contact him over the phone in about 2weeks to see how he is doing with intention to increase dose of that medication to may be 7.5 mg or maybe 10.  Past Medical History:  Diagnosis Date  . Dyslipidemia 07/09/2020  . Dyspnea on exertion 05/28/2020  . Essential hypertension 05/28/2020  . Knee pain   . Osteoarthritis of left patellofemoral joint   . Palpitations   . Supraventricular tachycardia (HCC) 07/09/2020    Past Surgical History:  Procedure Laterality Date  . CATARACT EXTRACTION, BILATERAL  2years ago  . HERNIA REPAIR  25 years ago    Current Medications: Current Meds  Medication Sig  . aspirin 81 MG EC tablet Take 81 mg by mouth every other day.  . Glucosamine Sulfate 1000 MG TABS Take 1,000 mg by mouth daily.  . hydrochlorothiazide (MICROZIDE) 12.5 MG capsule Take 1 capsule (12.5 mg total) by mouth daily.  Marland Kitchen losartan (COZAAR) 100 MG tablet TAKE 1 TABLET(100 MG) BY MOUTH DAILY (Patient taking differently: Take  100 mg by mouth daily.)  . metoprolol succinate (TOPROL-XL) 50 MG 24 hr tablet Take 1 tablet (50 mg total) by mouth daily. Take with or immediately following a meal.  . rosuvastatin (CRESTOR) 10 MG tablet TAKE 1 TABLET(10 MG) BY MOUTH DAILY (Patient taking differently: Take 10 mg by mouth daily.)  . spironolactone (ALDACTONE) 25 MG tablet Take 1 tablet (25 mg total) by mouth daily.     Allergies:   Patient has no known allergies.   Social History   Socioeconomic History  . Marital status: Married    Spouse name: Not on file  . Number of children: Not on file  . Years of education: Not on file  . Highest education level: Not on file  Occupational History  . Not on file  Tobacco Use  . Smoking status: Former Games developer  . Smokeless tobacco: Never Used  Substance and Sexual Activity  . Alcohol use: Never  . Drug use: Never  . Sexual activity: Not on file  Other Topics Concern  . Not on file  Social History Narrative  . Not on file   Social Determinants of Health   Financial Resource Strain: Not on file  Food Insecurity: Not on file  Transportation Needs: Not on file  Physical Activity: Not on file  Stress: Not on file  Social Connections: Not on file     Family History: The patient's family history includes Atrial fibrillation in his brother; Cancer in his  mother and paternal grandfather; Cancer - Other in his brother and father; Diabetes in his sister; Heart attack in his brother and maternal grandfather; Hypertension in his mother; Thyroid disease in his mother. ROS:   Please see the history of present illness.    All 14 point review of systems negative except as described per history of present illness  EKGs/Labs/Other Studies Reviewed:      Recent Labs: 05/23/2020: Hemoglobin 16.7; Platelets 196; TSH 1.819 11/17/2020: BUN 12; Creatinine, Ser 0.98; Potassium 4.0; Sodium 143  Recent Lipid Panel    Component Value Date/Time   CHOL 148 07/09/2020 1155   TRIG 77  07/09/2020 1155   HDL 57 07/09/2020 1155   CHOLHDL 2.6 07/09/2020 1155   LDLCALC 76 07/09/2020 1155    Physical Exam:    VS:  BP (!) 156/84 (BP Location: Right Arm, Patient Position: Sitting)   Pulse 60   Ht 5\' 10"  (1.778 m)   Wt 180 lb (81.6 kg)   SpO2 96%   BMI 25.83 kg/m     Wt Readings from Last 3 Encounters:  12/17/20 180 lb (81.6 kg)  11/12/20 180 lb (81.6 kg)  09/09/20 183 lb (83 kg)     GEN:  Well nourished, well developed in no acute distress HEENT: Normal NECK: No JVD; No carotid bruits LYMPHATICS: No lymphadenopathy CARDIAC: RRR, no murmurs, no rubs, no gallops RESPIRATORY:  Clear to auscultation without rales, wheezing or rhonchi  ABDOMEN: Soft, non-tender, non-distended MUSCULOSKELETAL:  No edema; No deformity  SKIN: Warm and dry LOWER EXTREMITIES: no swelling NEUROLOGIC:  Alert and oriented x 3 PSYCHIATRIC:  Normal affect   ASSESSMENT:    1. Essential hypertension   2. Dyslipidemia   3. Supraventricular tachycardia (HCC)   4. Palpitations    PLAN:    In order of problems listed above:  1. Essential hypertension plan still uncontrolled, plan as outlined above.  We will discontinue Aldactone and restart minoxidil 2.5 daily, will check his Chem-7 next week.  He is already on diuretic which I will continue.  We will contact him over the phone in 2 weeks and then decide about potentially increasing the dose of the medication if blood pressure is not well controlled. 2. Dyslipidemia: He is taking Crestor 10 which I will continue, last K PN I reviewed which showed LDL of 76 and HDL of 57.  We will continue present management. 3. Supraventricular tachycardia suppressed. 4. Palpitations denies having any. 5. Dizziness look like this is orthostatic I asked him to be well-hydrated.  And also will discontinue Aldactone   Medication Adjustments/Labs and Tests Ordered: Current medicines are reviewed at length with the patient today.  Concerns regarding medicines  are outlined above.  No orders of the defined types were placed in this encounter.  Medication changes: No orders of the defined types were placed in this encounter.   Signed, 11/09/20, MD, [facility] 12/17/2020 11:03 AM    [facility]

## 2020-12-17 NOTE — Patient Instructions (Signed)
Medication Instructions:  Your physician has recommended you make the following change in your medication:   STOP: Aldactone   START: Minoxidil 2.5 mg daily   *If you need a refill on your cardiac medications before your next appointment, please call your pharmacy*   Lab Work: Your physician recommends that you return for lab work 1 week: BMP   If you have labs (blood work) drawn today and your tests are completely normal, you will receive your results only by: Marland Kitchen MyChart Message (if you have MyChart) OR . A paper copy in the mail If you have any lab test that is abnormal or we need to change your treatment, we will call you to review the results.   Testing/Procedures: NONE   Follow-Up: At [facility], you and your health needs are our priority.  As part of our continuing mission to provide you with exceptional heart care, we have created designated Provider Care Teams.  These Care Teams include your primary Cardiologist (physician) and Advanced Practice Providers (APPs -  Physician Assistants and Nurse Practitioners) who all work together to provide you with the care you need, when you need it.  We recommend signing up for the patient portal called "MyChart".  Sign up information is provided on this After Visit Summary.  MyChart is used to connect with patients for Virtual Visits (Telemedicine)3.  Patients are able to view lab/test results, encounter notes, upcoming appointments, etc.  Non-urgent messages can be sent to your provider as well.   To learn more about what you can do with MyChart, go to [email or web address].    Your next appointment:   6 week(s)  The format for your next appointment:   In Person  Provider:   Gypsy Balsam, MD   Other Instructions  Minoxidil Oral Tablets What is this medicine? MINOXIDIL (mi NOX i dill) is a vasodilator. It treats high blood pressure. This medicine may be used for other purposes; ask your health care provider or  pharmacist if you have questions. COMMON BRAND NAME(S): Loniten What should I tell my health care provider before I take this medicine? They need to know if you have any of these conditions:  angina  heart or blood vessel disease  kidney disease  lung disease  pheochromocytoma  previous heart attack  an unusual or allergic reaction to minoxidil, other medicines, foods, dyes, or preservatives  pregnant or trying to get pregnant  breast-feeding How should I use this medicine? Take this drug by mouth. Take it as directed on the prescription label at the same time every day. You can take it with or without food. If it upsets your stomach, take it with food. Keep taking it unless your health care provider tells you to stop. Talk to your health care provider about the use of this drug in children. While it may be prescribed for selected conditions, precautions do apply. Overdosage: If you think you have taken too much of this medicine contact a poison control center or emergency room at once. NOTE: This medicine is only for you. Do not share this medicine with others. What if I miss a dose? If you miss a dose, take it as soon as you can. If it is almost time for your next dose, take only that dose. Do not take double or extra doses. What may interact with this medicine?  medicines for chest pain  medicines for high blood pressure This list may not describe all possible interactions. Give your health care provider a  list of all the medicines, herbs, non-prescription drugs, or dietary supplements you use. Also tell them if you smoke, drink alcohol, or use illegal drugs. Some items may interact with your medicine. What should I watch for while using this medicine? Check your heart rate and blood pressure regularly while you are taking this medicine. Ask your doctor or health care professional what your heart rate and blood pressure should be and when you should contact him or her. While  you are taking this medicine, keep a check on your weight. Tell your doctor or health care professional if you rapidly gain more then 5 pounds. You may get dizzy. Do not drive, use machinery, or do anything that needs mental alertness until you know how this medicine affects you. To reduce the risk of dizzy or fainting spells, do not sit or stand up quickly, especially if you are an older patient. Alcohol can make you more dizzy, and increase flushing and rapid heartbeats. Avoid alcoholic drinks. Your mouth may get dry. Chewing sugarless gum or sucking hard candy, and drinking plenty of water may help. Contact your doctor if the problem does not go away or is severe. Do not treat yourself for coughs, colds, or pain while you are taking this medicine without asking your doctor or health care professional for advice. Some ingredients may increase your blood pressure. You may need to follow a special low sodium diet while taking this medicine. Check with your doctor or health care professional. What side effects may I notice from receiving this medicine? Side effects that you should report to your doctor or health care professional as soon as possible:  chest pain, fast or irregular heartbeat, palpitations  difficulty breathing  dizziness or fainting spells  redness, blistering, peeling or loosening of the skin, including inside the mouth  skin rash or itching  stiff or swollen joints  sudden weight gain  swelling of the feet or legs  unusual weakness Side effects that usually do not require medical attention (report to your doctor or health care professional if they continue or are bothersome):  headache  unusual hair growth, on the face, arm, and back This list may not describe all possible side effects. Call your doctor for medical advice about side effects. You may report side effects to FDA at 1-800-FDA-1088. Where should I keep my medicine? Keep out of the reach of children and  pets. Store at room temperature between 20 and 25 degrees C (68 and 77 degrees F). Throw away any unused drug after the expiration date. NOTE: This sheet is a summary. It may not cover all possible information. If you have questions about this medicine, talk to your doctor, pharmacist, or health care provider.  2021 Elsevier/Gold Standard (2019-02-27 18:33:28)

## 2020-12-25 LAB — BASIC METABOLIC PANEL
BUN/Creatinine Ratio: 9 — ABNORMAL LOW (ref 10–24)
BUN: 8 mg/dL (ref 8–27)
CO2: 23 mmol/L (ref 20–29)
Calcium: 9.3 mg/dL (ref 8.6–10.2)
Chloride: 99 mmol/L (ref 96–106)
Creatinine, Ser: 0.9 mg/dL (ref 0.76–1.27)
Glucose: 101 mg/dL — ABNORMAL HIGH (ref 65–99)
Potassium: 3.8 mmol/L (ref 3.5–5.2)
Sodium: 139 mmol/L (ref 134–144)
eGFR: 90 mL/min/{1.73_m2} (ref >59)

## 2020-12-31 ENCOUNTER — Telehealth: Payer: Self-pay

## 2020-12-31 ENCOUNTER — Telehealth: Payer: Self-pay | Admitting: Emergency Medicine

## 2020-12-31 NOTE — Telephone Encounter (Signed)
Left message for patient to return call. Dr. Bing Matter wanted [place] to follow up with him to see how the recent medication change was treating him.

## 2020-12-31 NOTE — Telephone Encounter (Signed)
Left message on patients voicemail to please return our call.   Letter mailed to patient at this time.  

## 2020-12-31 NOTE — Telephone Encounter (Signed)
-----   Message from Robert J Krasowski, MD sent at 12/26/2020 10:55 AM EDT ----- Chem7 looks good, cont same management 

## 2020-12-31 NOTE — Telephone Encounter (Signed)
Patient notified of results.

## 2020-12-31 NOTE — Telephone Encounter (Signed)
-----   Message from Georgeanna Lea, MD sent at 12/26/2020 10:55 AM EDT ----- Zachary Barton looks good, cont same management

## 2021-01-05 NOTE — Telephone Encounter (Signed)
Left message for patient to return call.

## 2021-02-16 ENCOUNTER — Ambulatory Visit: Payer: Medicare Other | Admitting: Cardiology

## 2021-02-16 ENCOUNTER — Encounter: Payer: Self-pay | Admitting: Cardiology

## 2021-02-16 ENCOUNTER — Other Ambulatory Visit: Payer: Self-pay

## 2021-02-16 VITALS — BP 152/8 | HR 60 | Ht 70.0 in | Wt 180.0 lb

## 2021-02-16 DIAGNOSIS — E785 Hyperlipidemia, unspecified: Secondary | ICD-10-CM | POA: Diagnosis not present

## 2021-02-16 DIAGNOSIS — I471 Supraventricular tachycardia: Secondary | ICD-10-CM

## 2021-02-16 DIAGNOSIS — R0600 Dyspnea, unspecified: Secondary | ICD-10-CM

## 2021-02-16 DIAGNOSIS — I10 Essential (primary) hypertension: Secondary | ICD-10-CM | POA: Diagnosis not present

## 2021-02-16 DIAGNOSIS — R0609 Other forms of dyspnea: Secondary | ICD-10-CM

## 2021-02-16 MED ORDER — MINOXIDIL 2.5 MG PO TABS: 5.0000 mg | ORAL_TABLET | Freq: Every day | ORAL | 2 refills | Status: DC

## 2021-02-16 NOTE — Progress Notes (Signed)
Cardiology Office Note:    Date:  02/16/2021   ID:  Zachary Barton, DOB Oct 03, 1945, MRN [id number]  PCP:  Patient, No Pcp Per (Inactive)  Cardiologist:  Gypsy Balsam, MD    Referring MD: No ref. provider found   Chief Complaint  Patient presents with   Follow-up  I am doing fine  History of Present Illness:    Zachary Barton is a 75 y.o. male with past medical history significant for paroxysmal supraventricular tachycardia, essential hypertension, dyspnea on exertion, dyslipidemia.  We having some difficulty controlling his blood pressure.  He comes today 2 months for follow-up.  He brought blood pressure measurements was a still slightly on the higher side.  I initiated small dose of minoxidil as a testing dose.  Seems to be tolerating well today we will increase the dose to 5 mg daily also ask him to send blood pressure measurements in about 2 weeks if readings fine at that time we probably can go to 7.5 even 10 mg or even higher.  Depends what the situation will be.  He has no difficulty tolerating this medication, his Chem-7 was normal  Past Medical History:  Diagnosis Date   Dyslipidemia 07/09/2020   Dyspnea on exertion 05/28/2020   Essential hypertension 05/28/2020   Knee pain    Osteoarthritis of left patellofemoral joint    Palpitations    Supraventricular tachycardia (HCC) 07/09/2020    Past Surgical History:  Procedure Laterality Date   CATARACT EXTRACTION, BILATERAL  2years ago   HERNIA REPAIR  25 years ago    Current Medications: Current Meds  Medication Sig   aspirin 81 MG EC tablet Take 81 mg by mouth every other day.   Glucosamine Sulfate 1000 MG TABS Take 1,000 mg by mouth daily.   hydrochlorothiazide (MICROZIDE) 12.5 MG capsule Take 1 capsule (12.5 mg total) by mouth daily.   losartan (COZAAR) 100 MG tablet TAKE 1 TABLET(100 MG) BY MOUTH DAILY (Patient taking differently: Take 100 mg by mouth daily.)   metoprolol succinate (TOPROL-XL) 50 MG 24 hr tablet  Take 1 tablet (50 mg total) by mouth daily. Take with or immediately following a meal.   minoxidil (LONITEN) 2.5 MG tablet Take 1 tablet (2.5 mg total) by mouth daily.   rosuvastatin (CRESTOR) 10 MG tablet TAKE 1 TABLET(10 MG) BY MOUTH DAILY (Patient taking differently: Take 10 mg by mouth daily.)     Allergies:   Patient has no known allergies.   Social History   Socioeconomic History   Marital status: Married    Spouse name: Not on file   Number of children: Not on file   Years of education: Not on file   Highest education level: Not on file  Occupational History   Not on file  Tobacco Use   Smoking status: Former   Smokeless tobacco: Never  Substance and Sexual Activity   Alcohol use: Never   Drug use: Never   Sexual activity: Not on file  Other Topics Concern   Not on file  Social History Narrative   Not on file   Social Determinants of Health   Financial Resource Strain: Not on file  Food Insecurity: Not on file  Transportation Needs: Not on file  Physical Activity: Not on file  Stress: Not on file  Social Connections: Not on file     Family History: The patient's family history includes Atrial fibrillation in his brother; Cancer in his mother and paternal grandfather; Cancer - Other in his brother and  father; Diabetes in his sister; Heart attack in his brother and maternal grandfather; Hypertension in his mother; Thyroid disease in his mother. ROS:   Please see the history of present illness.    All 14 point review of systems negative except as described per history of present illness  EKGs/Labs/Other Studies Reviewed:      Recent Labs: 05/23/2020: Hemoglobin 16.7; Platelets 196; TSH 1.819 12/24/2020: BUN 8; Creatinine, Ser 0.90; Potassium 3.8; Sodium 139  Recent Lipid Panel    Component Value Date/Time   CHOL 148 07/09/2020 1155   TRIG 77 07/09/2020 1155   HDL 57 07/09/2020 1155   CHOLHDL 2.6 07/09/2020 1155   LDLCALC 76 07/09/2020 1155    Physical  Exam:    VS:  BP (!) 152/8 (BP Location: Left Arm, Patient Position: Sitting)   Pulse 60   Ht 5\' 10"  (1.778 m)   Wt 180 lb (81.6 kg)   SpO2 93%   BMI 25.83 kg/m     Wt Readings from Last 3 Encounters:  02/16/21 180 lb (81.6 kg)  12/17/20 180 lb (81.6 kg)  11/12/20 180 lb (81.6 kg)     GEN:  Well nourished, well developed in no acute distress HEENT: Normal NECK: No JVD; No carotid bruits LYMPHATICS: No lymphadenopathy CARDIAC: RRR, no murmurs, no rubs, no gallops RESPIRATORY:  Clear to auscultation without rales, wheezing or rhonchi  ABDOMEN: Soft, non-tender, non-distended MUSCULOSKELETAL:  No edema; No deformity  SKIN: Warm and dry LOWER EXTREMITIES: no swelling NEUROLOGIC:  Alert and oriented x 3 PSYCHIATRIC:  Normal affect   ASSESSMENT:    1. Essential hypertension   2. Supraventricular tachycardia (HCC)   3. Dyslipidemia   4. Dyspnea on exertion    PLAN:    In order of problems listed above:  Essential hypertension still uncontrolled plan as described above Supraventricular tachycardia: Denies having any palpitations Dyslipidemia: I did review K PN which show me his LDL of 76 HDL 57.  We will continue present management   Medication Adjustments/Labs and Tests Ordered: Current medicines are reviewed at length with the patient today.  Concerns regarding medicines are outlined above.  No orders of the defined types were placed in this encounter.  Medication changes: No orders of the defined types were placed in this encounter.   Signed, 01/12/21, MD, [facility] 02/16/2021 11:41 AM    [facility]

## 2021-02-16 NOTE — Patient Instructions (Addendum)
Medication Instructions:  Your physician has recommended you make the following change in your medication:  INCREASE: Minoxidil 5 mg (two tablets) once daily Message Dr. Bing Matter in 2 weeks with an update.  *If you need a refill on your cardiac medications before your next appointment, please call your pharmacy*   Lab Work: None If you have labs (blood work) drawn today and your tests are completely normal, you will receive your results only by: MyChart Message (if you have MyChart) OR A paper copy in the mail If you have any lab test that is abnormal or we need to change your treatment, we will call you to review the results.   Testing/Procedures: None   Follow-Up: At [facility], you and your health needs are our priority.  As part of our continuing mission to provide you with exceptional heart care, we have created designated Provider Care Teams.  These Care Teams include your primary Cardiologist (physician) and Advanced Practice Providers (APPs -  Physician Assistants and Nurse Practitioners) who all work together to provide you with the care you need, when you need it.  We recommend signing up for the patient portal called "MyChart".  Sign up information is provided on this After Visit Summary.  MyChart is used to connect with patients for Virtual Visits (Telemedicine).  Patients are able to view lab/test results, encounter notes, upcoming appointments, etc.  Non-urgent messages can be sent to your provider as well.   To learn more about what you can do with MyChart, go to [email or web address].    Your next appointment:   3 month(s)  The format for your next appointment:   In Person  Provider:   Gypsy Balsam, MD   Other Instructions

## 2021-02-16 NOTE — Addendum Note (Signed)
Addended by: Reynolds Bowl on: 02/16/2021 12:07 PM   Modules accepted: Orders

## 2021-03-07 ENCOUNTER — Other Ambulatory Visit: Payer: Self-pay | Admitting: Cardiology

## 2021-03-09 DIAGNOSIS — I10 Essential (primary) hypertension: Secondary | ICD-10-CM

## 2021-03-11 MED ORDER — MINOXIDIL 10 MG PO TABS: 10.0000 mg | ORAL_TABLET | Freq: Every day | ORAL | 3 refills | Status: DC

## 2021-03-18 ENCOUNTER — Other Ambulatory Visit: Payer: Self-pay

## 2021-03-18 DIAGNOSIS — I10 Essential (primary) hypertension: Secondary | ICD-10-CM

## 2021-03-19 ENCOUNTER — Telehealth: Payer: Self-pay

## 2021-03-19 LAB — BASIC METABOLIC PANEL
BUN/Creatinine Ratio: 11 (ref 10–24)
BUN: 11 mg/dL (ref 8–27)
CO2: 26 mmol/L (ref 20–29)
Calcium: 9.2 mg/dL (ref 8.6–10.2)
Chloride: 103 mmol/L (ref 96–106)
Creatinine, Ser: 0.96 mg/dL (ref 0.76–1.27)
Glucose: 100 mg/dL — ABNORMAL HIGH (ref 65–99)
Potassium: 3.9 mmol/L (ref 3.5–5.2)
Sodium: 142 mmol/L (ref 134–144)
eGFR: 83 mL/min/{1.73_m2} (ref >59)

## 2021-03-19 NOTE — Telephone Encounter (Signed)
Left message on patients voicemail to please return our call.   

## 2021-03-19 NOTE — Telephone Encounter (Signed)
-----   Message from Georgeanna Lea, MD sent at 03/19/2021  2:57 PM EDT ----- Chem-7 looks good, continue present management

## 2021-03-20 ENCOUNTER — Telehealth: Payer: Self-pay

## 2021-03-20 NOTE — Telephone Encounter (Signed)
-----   Message from Robert J Krasowski, MD sent at 03/19/2021  2:57 PM EDT ----- Chem-7 looks good, continue present management 

## 2021-03-20 NOTE — Telephone Encounter (Signed)
Left message on patients voicemail to please return our call.   

## 2021-03-23 ENCOUNTER — Telehealth: Payer: Self-pay

## 2021-03-23 NOTE — Telephone Encounter (Signed)
Left message on patients voicemail to please return our call.   Letter mailed to patient at this time.  

## 2021-03-23 NOTE — Telephone Encounter (Signed)
-----   Message from Robert J Krasowski, MD sent at 03/19/2021  2:57 PM EDT ----- Chem-7 looks good, continue present management 

## 2021-03-31 ENCOUNTER — Other Ambulatory Visit: Payer: Self-pay | Admitting: Cardiology

## 2021-05-05 IMAGING — CR DG CHEST 2V
2 series · 2 of 2 positions shown · non-contrast
Comparison: None.

CLINICAL DATA: Chest pain and tachycardia this morning.

EXAM:
CHEST - 2 VIEW

[w chest pa]
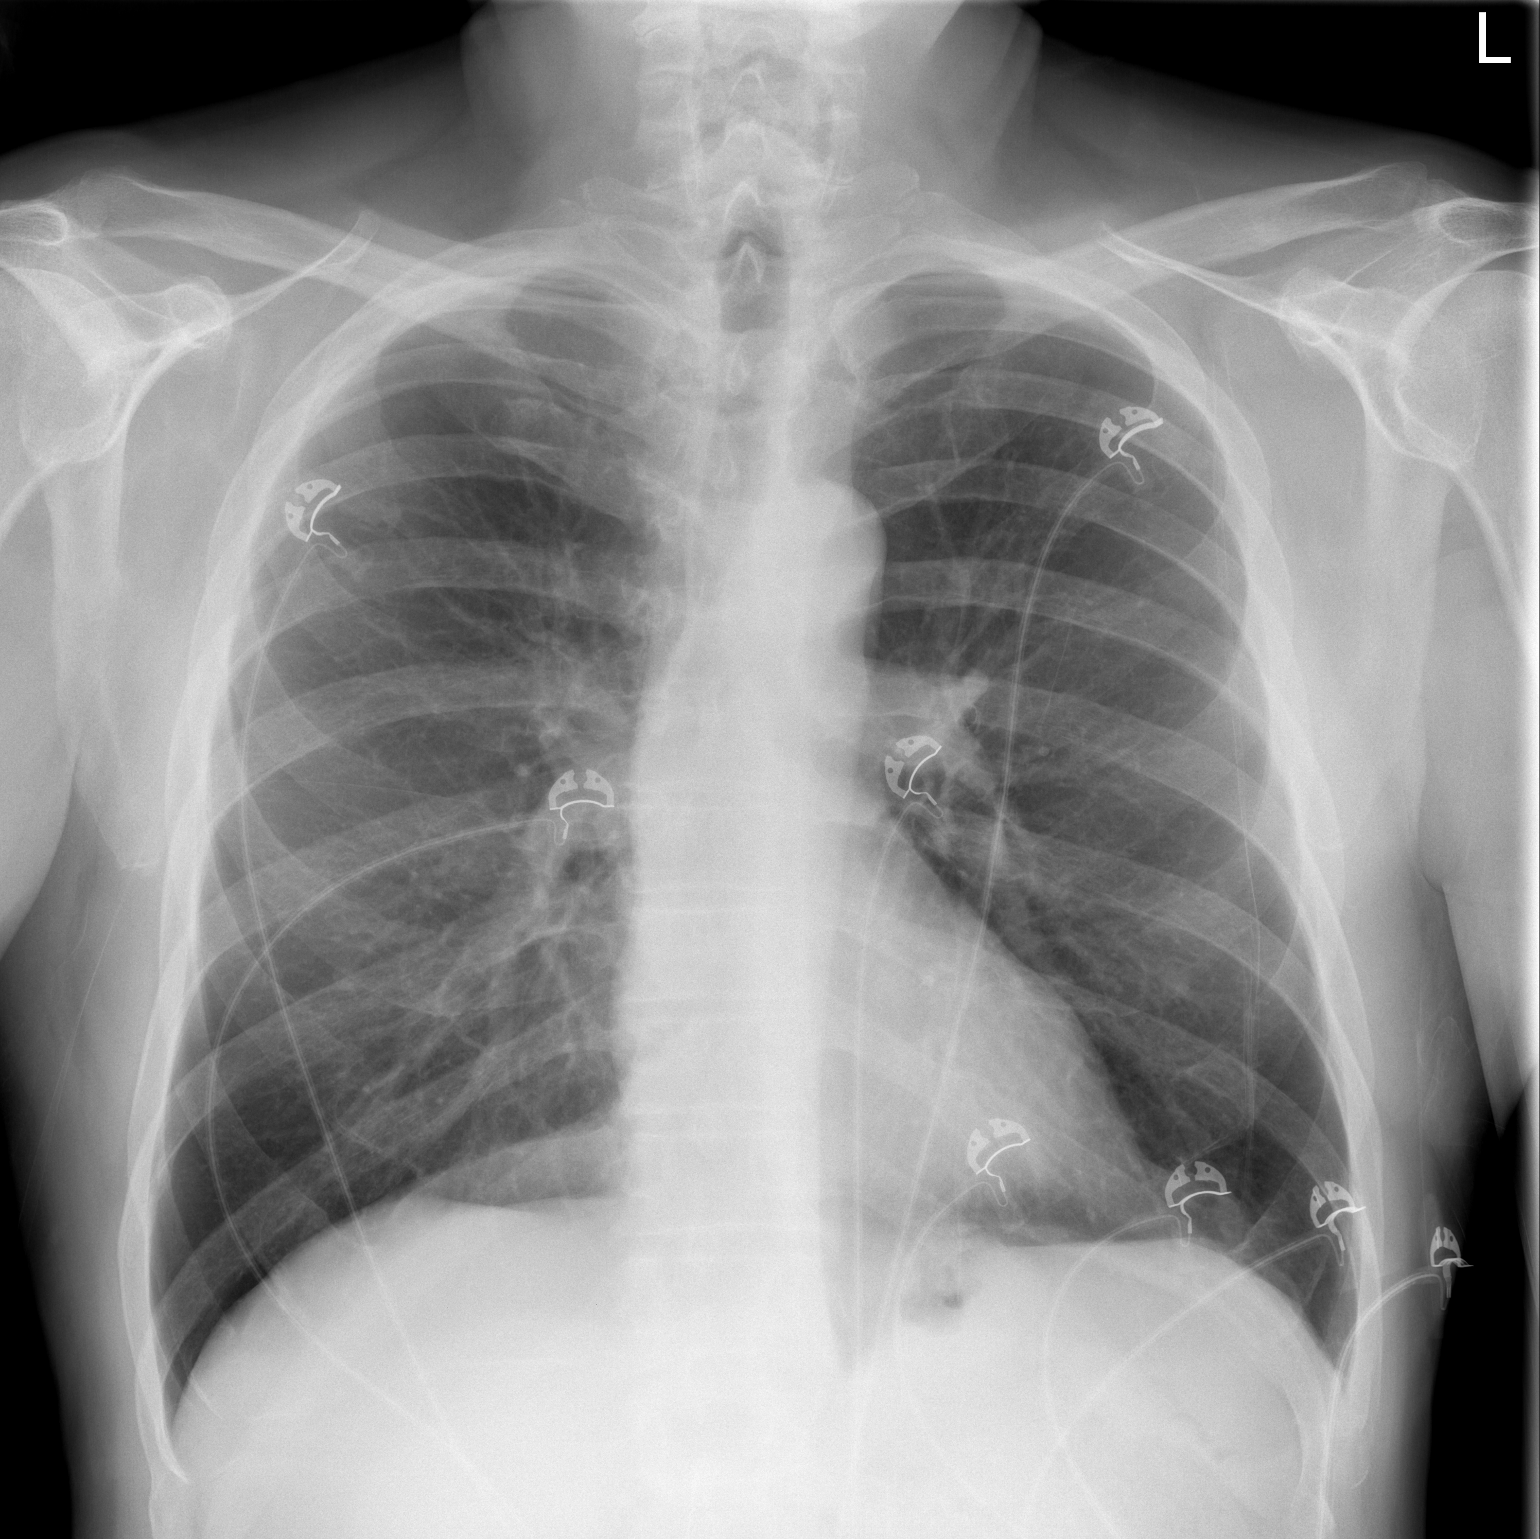

[w chest lat]
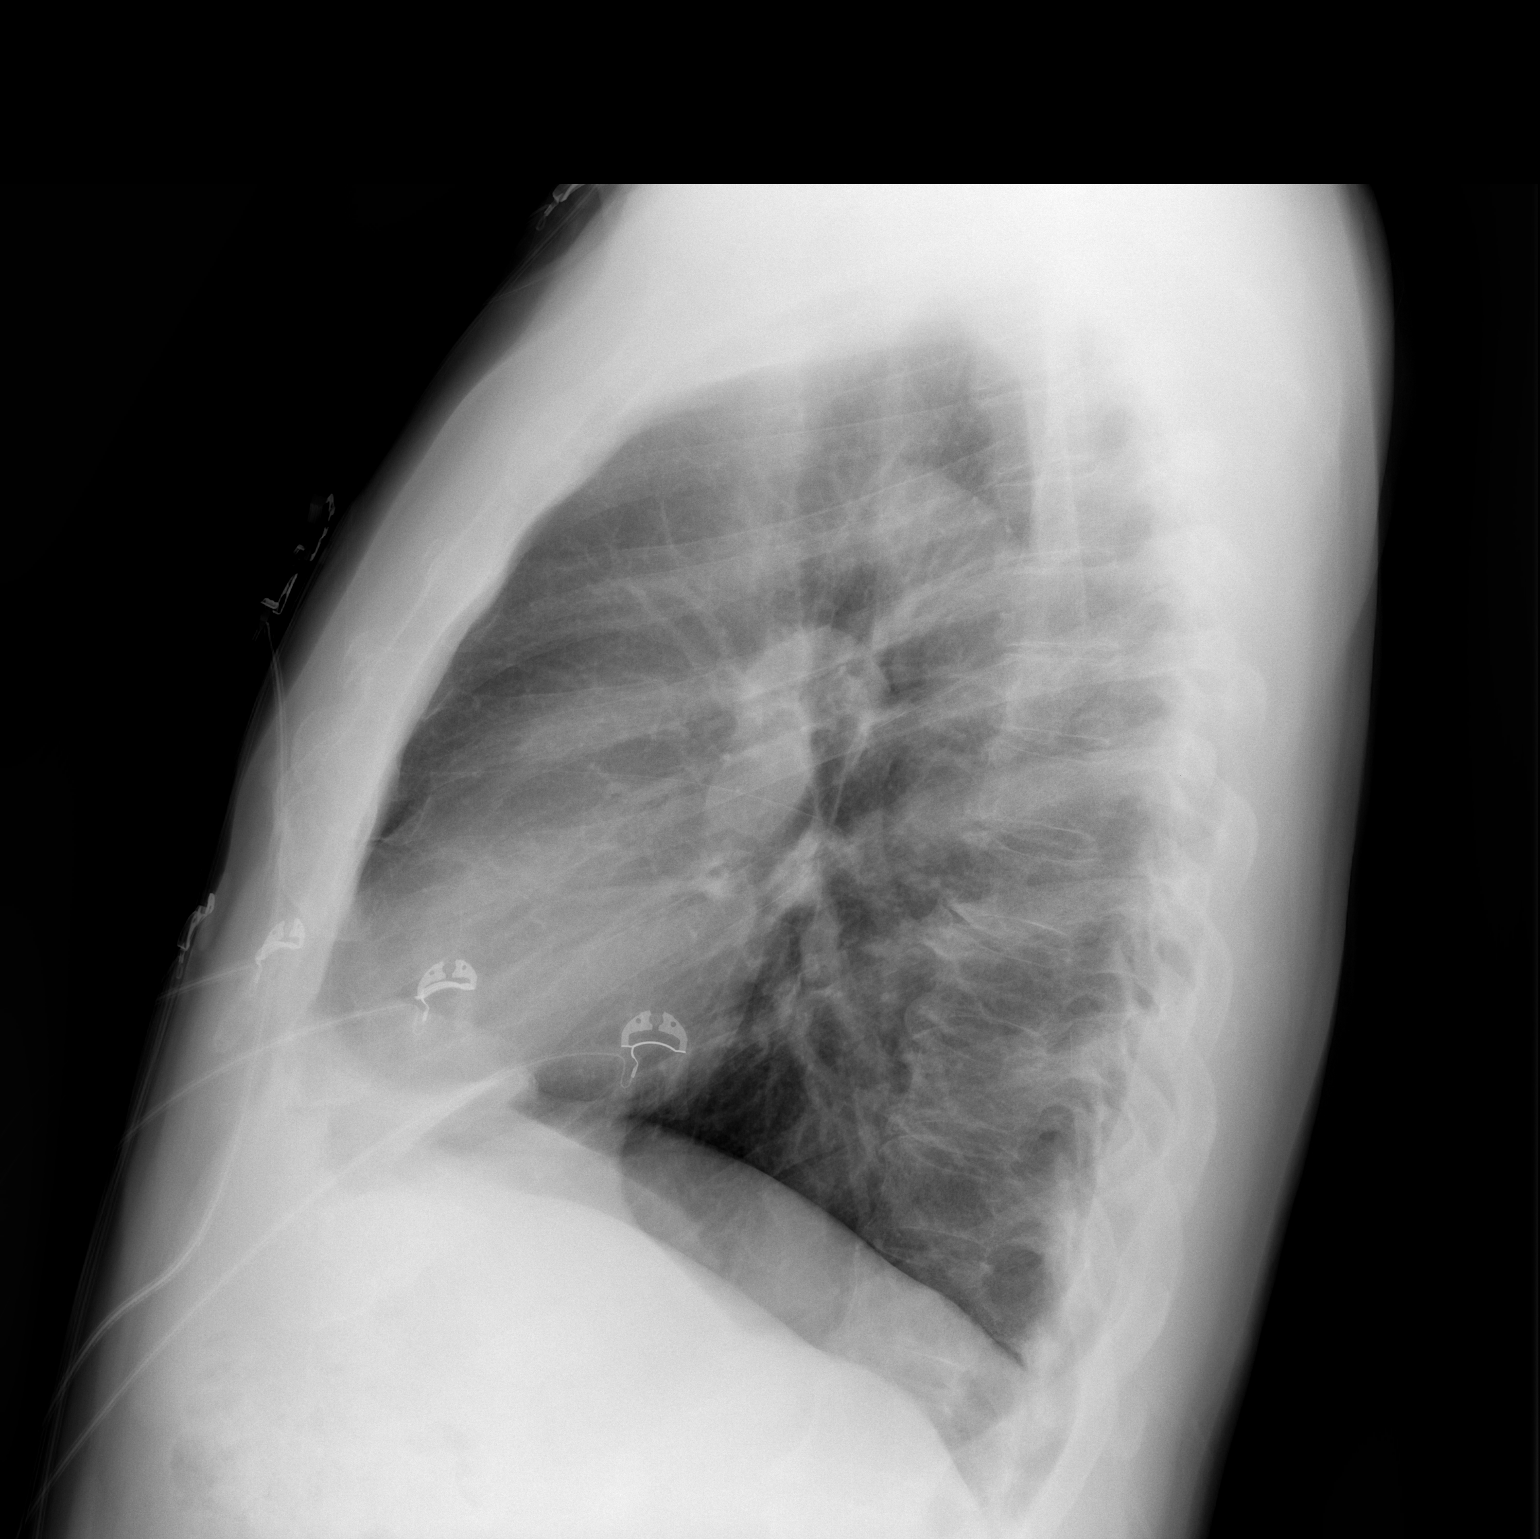

[2 of 2 positions shown; findings below may reference images not displayed]

FINDINGS: The cardiac silhouette, mediastinal and hilar contours are normal.
The lungs are clear. No pleural effusion. No pulmonary lesions. The
bony thorax is intact.
IMPRESSION: No acute cardiopulmonary findings.

## 2021-05-19 ENCOUNTER — Ambulatory Visit: Payer: Medicare Other | Admitting: Cardiology

## 2021-05-21 ENCOUNTER — Other Ambulatory Visit: Payer: Self-pay

## 2021-05-21 ENCOUNTER — Ambulatory Visit: Payer: Medicare Other | Admitting: Cardiology

## 2021-05-21 ENCOUNTER — Encounter: Payer: Self-pay | Admitting: Cardiology

## 2021-05-21 VITALS — BP 128/84 | HR 65 | Ht 70.0 in | Wt 185.0 lb

## 2021-05-21 DIAGNOSIS — I471 Supraventricular tachycardia: Secondary | ICD-10-CM | POA: Diagnosis not present

## 2021-05-21 DIAGNOSIS — R002 Palpitations: Secondary | ICD-10-CM

## 2021-05-21 DIAGNOSIS — E785 Hyperlipidemia, unspecified: Secondary | ICD-10-CM | POA: Diagnosis not present

## 2021-05-21 DIAGNOSIS — I10 Essential (primary) hypertension: Secondary | ICD-10-CM

## 2021-05-21 NOTE — Patient Instructions (Signed)

## 2021-05-21 NOTE — Progress Notes (Signed)
Cardiology Office Note:    Date:  05/21/2021   ID:  Zachary Barton, DOB [birth year], MRN [id number]  PCP:  Patient, No Pcp Per (Inactive)  Cardiologist:  Gypsy Balsam, MD    Referring MD: No ref. provider found   Chief Complaint  Patient presents with   Follow-up  I am doing fine  History of Present Illness:    Zachary Barton is a 75 y.o. male he was referred to [place] because of essential hypertension.  He also had some palpitations he was found to have supraventricular tachycardia.  Also dyslipidemia and dyspnea on exertion.  Overall he is doing well.  Described to have rare short episode of palpitation that does not bother him much.  His blood pressure today in the office is good however he tells me at home it is usually higher.  We did discuss the way that he need to check his blood pressure asked him to check his blood pressure twice a day for the next week or 2 and he will let me know how it looks like.  Past Medical History:  Diagnosis Date   Dyslipidemia 07/09/2020   Dyspnea on exertion 05/28/2020   Essential hypertension 05/28/2020   Knee pain    Osteoarthritis of left patellofemoral joint    Palpitations    Supraventricular tachycardia (HCC) 07/09/2020    Past Surgical History:  Procedure Laterality Date   CATARACT EXTRACTION, BILATERAL  2years ago   HERNIA REPAIR  25 years ago    Current Medications: Current Meds  Medication Sig   aspirin 81 MG EC tablet Take 81 mg by mouth every other day.   Glucosamine Sulfate 1000 MG TABS Take 1,000 mg by mouth daily.   hydrochlorothiazide (MICROZIDE) 12.5 MG capsule TAKE 1 CAPSULE(12.5 MG) BY MOUTH DAILY (Patient taking differently: Take 12.5 mg by mouth daily.)   losartan (COZAAR) 100 MG tablet TAKE 1 TABLET(100 MG) BY MOUTH DAILY (Patient taking differently: Take 100 mg by mouth daily.)   metoprolol succinate (TOPROL-XL) 50 MG 24 hr tablet Take 1 tablet (50 mg total) by mouth daily. Take with or immediately following a  meal.   minoxidil (LONITEN) 10 MG tablet Take 1 tablet (10 mg total) by mouth daily.   rosuvastatin (CRESTOR) 10 MG tablet TAKE 1 TABLET(10 MG) BY MOUTH DAILY (Patient taking differently: Take 10 mg by mouth daily.)     Allergies:   Patient has no known allergies.   Social History   Socioeconomic History   Marital status: Married    Spouse name: Not on file   Number of children: Not on file   Years of education: Not on file   Highest education level: Not on file  Occupational History   Not on file  Tobacco Use   Smoking status: Former   Smokeless tobacco: Never  Substance and Sexual Activity   Alcohol use: Never   Drug use: Never   Sexual activity: Not on file  Other Topics Concern   Not on file  Social History Narrative   Not on file   Social Determinants of Health   Financial Resource Strain: Not on file  Food Insecurity: Not on file  Transportation Needs: Not on file  Physical Activity: Not on file  Stress: Not on file  Social Connections: Not on file     Family History: The patient's family history includes Atrial fibrillation in his brother; Cancer in his mother and paternal grandfather; Cancer - Other in his brother and father; Diabetes in his sister;  Heart attack in his brother and maternal grandfather; Hypertension in his mother; Thyroid disease in his mother. ROS:   Please see the history of present illness.    All 14 point review of systems negative except as described per history of present illness  EKGs/Labs/Other Studies Reviewed:      Recent Labs: 05/23/2020: Hemoglobin 16.7; Platelets 196; TSH 1.819 03/18/2021: BUN 11; Creatinine, Ser 0.96; Potassium 3.9; Sodium 142  Recent Lipid Panel    Component Value Date/Time   CHOL 148 07/09/2020 1155   TRIG 77 07/09/2020 1155   HDL 57 07/09/2020 1155   CHOLHDL 2.6 07/09/2020 1155   LDLCALC 76 07/09/2020 1155    Physical Exam:    VS:  BP 128/84 (BP Location: Right Arm, Patient Position: Sitting)    Pulse 65   Ht 5\' 10"  (1.778 m)   Wt 185 lb (83.9 kg)   SpO2 95%   BMI 26.54 kg/m     Wt Readings from Last 3 Encounters:  05/21/21 185 lb (83.9 kg)  02/16/21 180 lb (81.6 kg)  12/17/20 180 lb (81.6 kg)     GEN:  Well nourished, well developed in no acute distress HEENT: Normal NECK: No JVD; No carotid bruits LYMPHATICS: No lymphadenopathy CARDIAC: RRR, no murmurs, no rubs, no gallops RESPIRATORY:  Clear to auscultation without rales, wheezing or rhonchi  ABDOMEN: Soft, non-tender, non-distended MUSCULOSKELETAL:  No edema; No deformity  SKIN: Warm and dry LOWER EXTREMITIES: no swelling NEUROLOGIC:  Alert and oriented x 3 PSYCHIATRIC:  Normal affect   ASSESSMENT:    1. Essential hypertension   2. Supraventricular tachycardia (HCC)   3. Palpitations   4. Dyslipidemia    PLAN:    In order of problems listed above:  Essential hypertension plan as described above I will not modify any of his medication today he will do regular blood pressure measurements to me in about 10 days and based on that we will decide what to do next. Supraventricular tachycardia very rare short lasting palpitations.  Not of concern we will continue present management which include beta-blocker. Palpitations plan as described above Dyslipidemia I did review his K PN which show me his LDL of 76 HDL 57 we will continue present management   Medication Adjustments/Labs and Tests Ordered: Current medicines are reviewed at length with the patient today.  Concerns regarding medicines are outlined above.  Orders Placed This Encounter  Procedures   EKG 12-Lead   Medication changes: No orders of the defined types were placed in this encounter.   Signed, 02/16/21, MD, [facility] 05/21/2021 10:53 AM    [facility]

## 2021-05-30 ENCOUNTER — Encounter: Payer: Self-pay | Admitting: Cardiology

## 2021-06-27 ENCOUNTER — Other Ambulatory Visit: Payer: Self-pay | Admitting: Cardiology

## 2021-07-29 ENCOUNTER — Ambulatory Visit: Payer: Medicare Other | Admitting: Cardiology

## 2021-08-15 ENCOUNTER — Other Ambulatory Visit: Payer: Self-pay | Admitting: Cardiology

## 2021-09-04 ENCOUNTER — Other Ambulatory Visit: Payer: Self-pay | Admitting: Cardiology

## 2021-09-08 ENCOUNTER — Other Ambulatory Visit: Payer: Self-pay | Admitting: Cardiology

## 2021-12-02 ENCOUNTER — Ambulatory Visit: Payer: Medicare Other | Admitting: Cardiology

## 2021-12-02 ENCOUNTER — Encounter: Payer: Self-pay | Admitting: Cardiology

## 2021-12-02 VITALS — BP 150/98 | HR 62 | Ht 70.0 in | Wt 184.0 lb

## 2021-12-02 DIAGNOSIS — E785 Hyperlipidemia, unspecified: Secondary | ICD-10-CM

## 2021-12-02 DIAGNOSIS — R002 Palpitations: Secondary | ICD-10-CM | POA: Diagnosis not present

## 2021-12-02 DIAGNOSIS — I471 Supraventricular tachycardia: Secondary | ICD-10-CM | POA: Diagnosis not present

## 2021-12-02 DIAGNOSIS — I10 Essential (primary) hypertension: Secondary | ICD-10-CM

## 2021-12-02 NOTE — Patient Instructions (Signed)

## 2021-12-02 NOTE — Progress Notes (Signed)
Cardiology Office Note:    Date:  12/02/2021   ID:  Zachary Barton, DOB 02-16-1946, MRN [id number]  PCP:  Patient, No Pcp Per (Inactive)  Cardiologist:  Gypsy Balsam, MD    Referring MD: No ref. provider found   Chief Complaint  Patient presents with   Follow-up  Doing fine  History of Present Illness:    Zachary Barton is a 76 y.o. male with past medical history significant for essential hypertension which was difficult to control, supraventricular tachycardia, asymptomatic, dyspnea on exertion, dyslipidemia.  Comes today to my office for follow-up.  Overall doing very well.  Denies have any chest pain tightness squeezing pressure burning chest no palpitation dizziness swelling of lower extremities.  His blood pressure is elevated in the office like always he said at home usually 130/70.  Past Medical History:  Diagnosis Date   Dyslipidemia 07/09/2020   Dyspnea on exertion 05/28/2020   Essential hypertension 05/28/2020   Knee pain    Osteoarthritis of left patellofemoral joint    Palpitations    Supraventricular tachycardia (HCC) 07/09/2020    Past Surgical History:  Procedure Laterality Date   CATARACT EXTRACTION, BILATERAL  2years ago   HERNIA REPAIR  25 years ago    Current Medications: Current Meds  Medication Sig   aspirin 81 MG EC tablet Take 81 mg by mouth every other day.   Glucosamine Sulfate 1000 MG TABS Take 1,000 mg by mouth daily.   hydrochlorothiazide (MICROZIDE) 12.5 MG capsule Take 1 capsule (12.5 mg total) by mouth daily.   losartan (COZAAR) 100 MG tablet Take 1 tablet (100 mg total) by mouth daily.   metoprolol succinate (TOPROL-XL) 50 MG 24 hr tablet TAKE 1 TABLET(50 MG) BY MOUTH DAILY WITH OR IMMEDIATELY FOLLOWING A MEAL (Patient taking differently: Take 50 mg by mouth daily.)   minoxidil (LONITEN) 10 MG tablet Take 1 tablet (10 mg total) by mouth daily.   rosuvastatin (CRESTOR) 10 MG tablet Take 1 tablet (10 mg total) by mouth daily.      Allergies:   Patient has no known allergies.   Social History   Socioeconomic History   Marital status: Married    Spouse name: Not on file   Number of children: Not on file   Years of education: Not on file   Highest education level: Not on file  Occupational History   Not on file  Tobacco Use   Smoking status: Former   Smokeless tobacco: Never  Substance and Sexual Activity   Alcohol use: Never   Drug use: Never   Sexual activity: Not on file  Other Topics Concern   Not on file  Social History Narrative   Not on file   Social Determinants of Health   Financial Resource Strain: Not on file  Food Insecurity: Not on file  Transportation Needs: Not on file  Physical Activity: Not on file  Stress: Not on file  Social Connections: Not on file     Family History: The patient's family history includes Atrial fibrillation in his brother; Cancer in his mother and paternal grandfather; Cancer - Other in his brother and father; Diabetes in his sister; Heart attack in his brother and maternal grandfather; Hypertension in his mother; Thyroid disease in his mother. ROS:   Please see the history of present illness.    All 14 point review of systems negative except as described per history of present illness  EKGs/Labs/Other Studies Reviewed:      Recent Labs: 03/18/2021: BUN 11;  Creatinine, Ser 0.96; Potassium 3.9; Sodium 142  Recent Lipid Panel    Component Value Date/Time   CHOL 148 07/09/2020 1155   TRIG 77 07/09/2020 1155   HDL 57 07/09/2020 1155   CHOLHDL 2.6 07/09/2020 1155   LDLCALC 76 07/09/2020 1155    Physical Exam:    VS:  BP (!) 150/98 (BP Location: Left Arm, Patient Position: Sitting)   Pulse 62   Ht 5\' 10"  (1.778 m)   Wt 184 lb (83.5 kg)   SpO2 97%   BMI 26.40 kg/m     Wt Readings from Last 3 Encounters:  12/02/21 184 lb (83.5 kg)  05/21/21 185 lb (83.9 kg)  02/16/21 180 lb (81.6 kg)     GEN:  Well nourished, well developed in no acute  distress HEENT: Normal NECK: No JVD; No carotid bruits LYMPHATICS: No lymphadenopathy CARDIAC: RRR, no murmurs, no rubs, no gallops RESPIRATORY:  Clear to auscultation without rales, wheezing or rhonchi  ABDOMEN: Soft, non-tender, non-distended MUSCULOSKELETAL:  No edema; No deformity  SKIN: Warm and dry LOWER EXTREMITIES: no swelling NEUROLOGIC:  Alert and oriented x 3 PSYCHIATRIC:  Normal affect   ASSESSMENT:    1. Essential hypertension   2. Supraventricular tachycardia (HCC)   3. Palpitations   4. Dyslipidemia    PLAN:    In order of problems listed above:  Essential hypertension seems to be well controlled continue present management I still asked him to religiously check his blood pressure on the regular basis relatively now blood pressure is elevated. Supraventricular tachycardia.  Asymptomatic from that point review small dose of beta-blocker which I will continue Palpitations denies having any Dyslipidemia I did review his K PN which showed only LDL of 76 HDL 57 and reasonable cholesterol continue   Medication Adjustments/Labs and Tests Ordered: Current medicines are reviewed at length with the patient today.  Concerns regarding medicines are outlined above.  No orders of the defined types were placed in this encounter.  Medication changes: No orders of the defined types were placed in this encounter.   Signed, 04/18/21, MD, [facility] 12/02/2021 10:42 AM    [facility]

## 2022-03-08 ENCOUNTER — Other Ambulatory Visit: Payer: Self-pay | Admitting: Cardiology

## 2022-03-08 NOTE — Telephone Encounter (Signed)
Rx refill sent to pharmacy. 

## 2022-03-27 ENCOUNTER — Encounter: Payer: Self-pay | Admitting: Cardiology

## 2022-06-04 ENCOUNTER — Other Ambulatory Visit: Payer: Self-pay | Admitting: Cardiology

## 2022-06-15 ENCOUNTER — Encounter: Payer: Self-pay | Admitting: Cardiology

## 2022-06-15 ENCOUNTER — Ambulatory Visit: Payer: Medicare Other | Attending: Cardiology | Admitting: Cardiology

## 2022-06-15 VITALS — BP 148/80 | HR 61 | Ht 70.0 in | Wt 181.0 lb

## 2022-06-15 DIAGNOSIS — I10 Essential (primary) hypertension: Secondary | ICD-10-CM

## 2022-06-15 DIAGNOSIS — I471 Supraventricular tachycardia, unspecified: Secondary | ICD-10-CM | POA: Diagnosis not present

## 2022-06-15 DIAGNOSIS — E785 Hyperlipidemia, unspecified: Secondary | ICD-10-CM | POA: Diagnosis not present

## 2022-06-15 DIAGNOSIS — R002 Palpitations: Secondary | ICD-10-CM

## 2022-06-15 MED ORDER — MINOXIDIL 10 MG PO TABS: 10.0000 mg | ORAL_TABLET | Freq: Every day | ORAL | 3 refills | Status: DC

## 2022-06-15 MED ORDER — MINOXIDIL 2.5 MG PO TABS: 2.5000 mg | ORAL_TABLET | Freq: Every day | ORAL | 3 refills | Status: DC

## 2022-06-15 NOTE — Patient Instructions (Addendum)
Medication Instructions:    Minoxidil 12.5mg  for a few days, If tolerated go to 15mg  daily- The in 1 week come for lab work   Lab Work: Your physician recommends that you return for lab work in: 1 week after target dose of Minoxidil and when you are fasting You need to have labs done when you are fasting.  You can come 2nd Floor- [address] Monday thru Thursday:   8-11:30  &  1pm- 4:30pm  Friday: 8am-11:30am   You do not need to make an appointment as the order has already been placed. The labs you are going to have done are BMET, Lipids, AST., ALT   Testing/Procedures: None Ordered   Follow-Up: At [facility], you and your health needs are our priority.  As part of our continuing mission to provide you with exceptional heart care, we have created designated Provider Care Teams.  These Care Teams include your primary Cardiologist (physician) and Advanced Practice Providers (APPs -  Physician Assistants and Nurse Practitioners) who all work together to provide you with the care you need, when you need it.  We recommend signing up for the patient portal called "MyChart".  Sign up information is provided on this After Visit Summary.  MyChart is used to connect with patients for Virtual Visits (Telemedicine).  Patients are able to view lab/test results, encounter notes, upcoming appointments, etc.  Non-urgent messages can be sent to your provider as well.   To learn more about what you can do with MyChart, go to [facility].    Your next appointment:   6 month(s)  The format for your next appointment:   In Person  Provider:   [email or web address], MD    Other Instructions NA

## 2022-06-15 NOTE — Progress Notes (Signed)
Cardiology Office Note:    Date:  06/15/2022   ID:  Zachary Barton, DOB 1946/04/29, MRN [id number]  PCP:  Patient, No Pcp Per  Cardiologist:  Gypsy Balsam, MD    Referring MD: No ref. provider found     History of Present Illness:    Zachary Barton is a 76 y.o. male with past medical history significant for essential hypertension which appears to be difficult to control, supraventricular tachycardia, dyspnea exertion, dyslipidemia.  He is in my office today for follow-up.  Overall doing very well.  He denies have any chest pain tightness squeezing pressure burning chest.  His blood pressure seems to be elevated and we have difficulty controlling it.  He is however doing well overall he exercised on the regular basis  Past Medical History:  Diagnosis Date   Dyslipidemia 07/09/2020   Dyspnea on exertion 05/28/2020   Essential hypertension 05/28/2020   Knee pain    Osteoarthritis of left patellofemoral joint    Palpitations    Supraventricular tachycardia 07/09/2020    Past Surgical History:  Procedure Laterality Date   CATARACT EXTRACTION, BILATERAL  2years ago   HERNIA REPAIR  25 years ago    Current Medications: Current Meds  Medication Sig   aspirin 81 MG EC tablet Take 81 mg by mouth every other day.   Glucosamine Sulfate 1000 MG TABS Take 1,000 mg by mouth daily.   hydrochlorothiazide (MICROZIDE) 12.5 MG capsule Take 1 capsule (12.5 mg total) by mouth daily.   losartan (COZAAR) 100 MG tablet Take 1 tablet (100 mg total) by mouth daily.   metoprolol succinate (TOPROL-XL) 50 MG 24 hr tablet TAKE 1 TABLET(50 MG) BY MOUTH DAILY WITH OR IMMEDIATELY FOLLOWING A MEAL (Patient taking differently: Take 50 mg by mouth daily.)   minoxidil (LONITEN) 10 MG tablet TAKE 1 TABLET(10 MG) BY MOUTH DAILY (Patient taking differently: Take 10 mg by mouth daily.)   rosuvastatin (CRESTOR) 10 MG tablet Take 1 tablet (10 mg total) by mouth daily.     Allergies:   Patient has no known  allergies.   Social History   Socioeconomic History   Marital status: Married    Spouse name: Not on file   Number of children: Not on file   Years of education: Not on file   Highest education level: Not on file  Occupational History   Not on file  Tobacco Use   Smoking status: Former   Smokeless tobacco: Never  Substance and Sexual Activity   Alcohol use: Never   Drug use: Never   Sexual activity: Not on file  Other Topics Concern   Not on file  Social History Narrative   Not on file   Social Determinants of Health   Financial Resource Strain: Not on file  Food Insecurity: Not on file  Transportation Needs: Not on file  Physical Activity: Not on file  Stress: Not on file  Social Connections: Not on file     Family History: The patient's family history includes Atrial fibrillation in his brother; Cancer in his mother and paternal grandfather; Cancer - Other in his brother and father; Diabetes in his sister; Heart attack in his brother and maternal grandfather; Hypertension in his mother; Thyroid disease in his mother. ROS:   Please see the history of present illness.    All 14 point review of systems negative except as described per history of present illness  EKGs/Labs/Other Studies Reviewed:      Recent Labs: No results found for  requested labs within last 365 days.  Recent Lipid Panel    Component Value Date/Time   CHOL 148 07/09/2020 1155   TRIG 77 07/09/2020 1155   HDL 57 07/09/2020 1155   CHOLHDL 2.6 07/09/2020 1155   LDLCALC 76 07/09/2020 1155    Physical Exam:    VS:  BP (!) 148/80 (BP Location: Left Arm, Patient Position: Sitting)   Pulse 61   Ht 5\' 10"  (1.778 m)   Wt 181 lb (82.1 kg)   SpO2 98%   BMI 25.97 kg/m     Wt Readings from Last 3 Encounters:  06/15/22 181 lb (82.1 kg)  12/02/21 184 lb (83.5 kg)  05/21/21 185 lb (83.9 kg)     GEN:  Well nourished, well developed in no acute distress HEENT: Normal NECK: No JVD; No carotid  bruits LYMPHATICS: No lymphadenopathy CARDIAC: RRR, no murmurs, no rubs, no gallops RESPIRATORY:  Clear to auscultation without rales, wheezing or rhonchi  ABDOMEN: Soft, non-tender, non-distended MUSCULOSKELETAL:  No edema; No deformity  SKIN: Warm and dry LOWER EXTREMITIES: no swelling NEUROLOGIC:  Alert and oriented x 3 PSYCHIATRIC:  Normal affect   ASSESSMENT:    1. Essential hypertension   2. Supraventricular tachycardia   3. Dyslipidemia   4. Palpitations    PLAN:    In order of problems listed above:  Essential hypertension: Uncontrolled I will ask him to increase dose of minoxidil he takes 10 mg we will go to 12.5 and eventually to 15, we will check his Chem-7 in about a week after we establish target dose. Supraventricular tachycardia: Denies have any palpitations.  He is taking Toprol XL 50 mg which I continue Dyslipidemia I did review K PN which show me data from 2021 where LDL was 76 HDL 57.  I will schedule him to have fasting lipid profile done.   Medication Adjustments/Labs and Tests Ordered: Current medicines are reviewed at length with the patient today.  Concerns regarding medicines are outlined above.  No orders of the defined types were placed in this encounter.  Medication changes: No orders of the defined types were placed in this encounter.   Signed, 2022, MD, [facility] 06/15/2022 10:28 AM    [facility]

## 2022-06-15 NOTE — Addendum Note (Signed)
Addended by: Baldo Ash D on: 06/15/2022 10:36 AM   Modules accepted: Orders

## 2022-06-15 NOTE — Addendum Note (Signed)
Addended by: Baldo Ash D on: 06/15/2022 10:53 AM   Modules accepted: Orders

## 2022-06-24 ENCOUNTER — Other Ambulatory Visit: Payer: Self-pay | Admitting: Cardiology

## 2022-06-24 LAB — ALT: ALT: 11 IU/L (ref 0–44)

## 2022-06-24 LAB — LIPID PANEL
Chol/HDL Ratio: 2.4 ratio (ref 0.0–5.0)
Cholesterol, Total: 148 mg/dL (ref 100–199)
HDL: 61 mg/dL (ref >39)
LDL Chol Calc (NIH): 74 mg/dL (ref 0–99)
Triglycerides: 67 mg/dL (ref 0–149)
VLDL Cholesterol Cal: 13 mg/dL (ref 5–40)

## 2022-06-24 LAB — BASIC METABOLIC PANEL
BUN/Creatinine Ratio: 12 (ref 10–24)
BUN: 12 mg/dL (ref 8–27)
CO2: 23 mmol/L (ref 20–29)
Calcium: 8.9 mg/dL (ref 8.6–10.2)
Chloride: 104 mmol/L (ref 96–106)
Creatinine, Ser: 1.01 mg/dL (ref 0.76–1.27)
Glucose: 97 mg/dL (ref 70–99)
Potassium: 3.9 mmol/L (ref 3.5–5.2)
Sodium: 142 mmol/L (ref 134–144)
eGFR: 78 mL/min/{1.73_m2} (ref >59)

## 2022-06-24 LAB — AST: AST: 15 IU/L (ref 0–40)

## 2022-06-30 ENCOUNTER — Telehealth: Payer: Self-pay

## 2022-06-30 NOTE — Telephone Encounter (Signed)
Pt viewed My Chart message with results. Routed to PCP.

## 2022-08-02 ENCOUNTER — Other Ambulatory Visit: Payer: Self-pay

## 2022-08-02 ENCOUNTER — Telehealth: Payer: Self-pay | Admitting: Cardiology

## 2022-08-02 ENCOUNTER — Encounter: Payer: Self-pay | Admitting: Cardiology

## 2022-08-02 MED ORDER — MINOXIDIL 10 MG PO TABS: 15.0000 mg | ORAL_TABLET | Freq: Every day | ORAL | 3 refills | Status: DC

## 2022-08-02 NOTE — Telephone Encounter (Signed)
Called [employer] and clarified the patient's minoxidil dose.

## 2022-08-02 NOTE — Telephone Encounter (Signed)
  Pt c/o medication issue:  1. Name of Medication:   minoxidil (LONITEN) 10 MG tablet    2. How are you currently taking this medication (dosage and times per day)?   Take 1.5 tablets (15 mg total) by mouth daily. TAKE 1 TABLET(10 MG) BY MOUTH DAILY    3. Are you having a reaction (difficulty breathing--STAT)? No   4. What is your medication issue? [facility] calling, they would like to clarify the dose instructions for this medication

## 2022-08-26 ENCOUNTER — Other Ambulatory Visit: Payer: Self-pay | Admitting: Cardiology

## 2022-09-04 ENCOUNTER — Other Ambulatory Visit: Payer: Self-pay | Admitting: Cardiology

## 2022-09-06 NOTE — Telephone Encounter (Signed)
Refill to pharmacy 

## 2022-12-02 ENCOUNTER — Encounter: Payer: Self-pay | Admitting: Cardiology

## 2022-12-02 ENCOUNTER — Other Ambulatory Visit: Payer: Self-pay | Admitting: Cardiology

## 2022-12-02 ENCOUNTER — Other Ambulatory Visit: Payer: Self-pay

## 2022-12-02 MED ORDER — HYDROCHLOROTHIAZIDE 12.5 MG PO CAPS: ORAL_CAPSULE | ORAL | 3 refills | Status: DC

## 2022-12-02 NOTE — Telephone Encounter (Signed)
Rx sent to pharmacy   

## 2022-12-02 NOTE — Telephone Encounter (Signed)
Ref HCTZ 12.5 #90 ref x 3

## 2022-12-07 ENCOUNTER — Ambulatory Visit: Payer: Medicare Other | Attending: Cardiology | Admitting: Cardiology

## 2022-12-07 ENCOUNTER — Encounter: Payer: Self-pay | Admitting: Cardiology

## 2022-12-07 VITALS — BP 138/90 | HR 64 | Ht 70.0 in | Wt 180.0 lb

## 2022-12-07 DIAGNOSIS — R002 Palpitations: Secondary | ICD-10-CM | POA: Diagnosis not present

## 2022-12-07 DIAGNOSIS — I10 Essential (primary) hypertension: Secondary | ICD-10-CM

## 2022-12-07 DIAGNOSIS — I471 Supraventricular tachycardia, unspecified: Secondary | ICD-10-CM | POA: Diagnosis not present

## 2022-12-07 DIAGNOSIS — E785 Hyperlipidemia, unspecified: Secondary | ICD-10-CM

## 2022-12-07 NOTE — Patient Instructions (Addendum)
Medication Instructions:  Your physician recommends that you continue on your current medications as directed. Please refer to the Current Medication list given to you today.  *If you need a refill on your cardiac medications before your next appointment, please call your pharmacy*   Lab Work: 3rd Floor [address] BMP- today If you have labs (blood work) drawn today and your tests are completely normal, you will receive your results only by: MyChart Message (if you have MyChart) OR A paper copy in the mail If you have any lab test that is abnormal or we need to change your treatment, we will call you to review the results.   Testing/Procedures: None Ordered   Follow-Up: At [facility], you and your health needs are our priority.  As part of our continuing mission to provide you with exceptional heart care, we have created designated Provider Care Teams.  These Care Teams include your primary Cardiologist (physician) and Advanced Practice Providers (APPs -  Physician Assistants and Nurse Practitioners) who all work together to provide you with the care you need, when you need it.  We recommend signing up for the patient portal called "MyChart".  Sign up information is provided on this After Visit Summary.  MyChart is used to connect with patients for Virtual Visits (Telemedicine).  Patients are able to view lab/test results, encounter notes, upcoming appointments, etc.  Non-urgent messages can be sent to your provider as well.   To learn more about what you can do with MyChart, go to [email or web address].com.    Your next appointment:   6 month(s)  The format for your next appointment:   In Person  Provider:   Robert Krasowski, MD    Other Instructions NA  

## 2022-12-07 NOTE — Progress Notes (Signed)
Cardiology Office Note:    Date:  12/07/2022   ID:  Zachary Barton, DOB 02-07-1946, MRN [id number]  PCP:  Patient, No Pcp Per  Cardiologist:  Gypsy Balsam, MD    Referring MD: No ref. provider found   Chief Complaint  Patient presents with   Follow-up    History of Present Illness:    Zachary Barton is a 77 y.o. male with past medical history significant for essential hypertension, supraventricular tachycardia, [place] exertion, dyslipidemia.  Comes today to months for follow-up.  Overall seems to be doing well.  No chest pain tightness squeezing pressure burning chest.  Busy working the garden with no difficulties.  He exercise every other day with no travel  Past Medical History:  Diagnosis Date   Dyslipidemia 07/09/2020   Dyspnea on exertion 05/28/2020   Essential hypertension 05/28/2020   Knee pain    Osteoarthritis of left patellofemoral joint    Palpitations    Supraventricular tachycardia 07/09/2020    Past Surgical History:  Procedure Laterality Date   CATARACT EXTRACTION, BILATERAL  2years ago   HERNIA REPAIR  25 years ago    Current Medications: Current Meds  Medication Sig   aspirin 81 MG EC tablet Take 81 mg by mouth every other day.   Glucosamine Sulfate 1000 MG TABS Take 1,000 mg by mouth daily.   hydrochlorothiazide (MICROZIDE) 12.5 MG capsule TAKE 1 CAPSULE(12.5 MG) BY MOUTH DAILY (Patient taking differently: Take 12.5 mg by mouth daily. TAKE 1 CAPSULE(12.5 MG) BY MOUTH DAILY)   losartan (COZAAR) 100 MG tablet TAKE 1 TABLET(100 MG) BY MOUTH DAILY (Patient taking differently: Take 100 mg by mouth daily.)   metoprolol succinate (TOPROL-XL) 50 MG 24 hr tablet Take 1 tablet (50 mg total) by mouth daily.   minoxidil (LONITEN) 10 MG tablet Take 1.5 tablets (15 mg total) by mouth daily. TAKE 1 TABLET(10 MG) BY MOUTH DAILY   rosuvastatin (CRESTOR) 10 MG tablet Take 1 tablet (10 mg total) by mouth daily.     Allergies:   Patient has no known allergies.    Social History   Socioeconomic History   Marital status: Married    Spouse name: Not on file   Number of children: Not on file   Years of education: Not on file   Highest education level: Not on file  Occupational History   Not on file  Tobacco Use   Smoking status: Former   Smokeless tobacco: Never  Substance and Sexual Activity   Alcohol use: Never   Drug use: Never   Sexual activity: Not on file  Other Topics Concern   Not on file  Social History Narrative   Not on file   Social Determinants of Health   Financial Resource Strain: Not on file  Food Insecurity: Not on file  Transportation Needs: Not on file  Physical Activity: Not on file  Stress: Not on file  Social Connections: Not on file     Family History: The patient's family history includes Atrial fibrillation in his brother; Cancer in his mother and paternal grandfather; Cancer - Other in his brother and father; Diabetes in his sister; Heart attack in his brother and maternal grandfather; Hypertension in his mother; Thyroid disease in his mother. ROS:   Please see the history of present illness.    All 14 point review of systems negative except as described per history of present illness  EKGs/Labs/Other Studies Reviewed:      Recent Labs: 06/23/2022: ALT 11; BUN 12;  Creatinine, Ser 1.01; Potassium 3.9; Sodium 142  Recent Lipid Panel    Component Value Date/Time   CHOL 148 06/23/2022 0900   TRIG 67 06/23/2022 0900   HDL 61 06/23/2022 0900   CHOLHDL 2.4 06/23/2022 0900   LDLCALC 74 06/23/2022 0900    Physical Exam:    VS:  BP (!) 138/90 (BP Location: Left Arm, Patient Position: Sitting)   Pulse 64   Ht 5\' 10"  (1.778 m)   Wt 180 lb (81.6 kg)   SpO2 99%   BMI 25.83 kg/m     Wt Readings from Last 3 Encounters:  12/07/22 180 lb (81.6 kg)  06/15/22 181 lb (82.1 kg)  12/02/21 184 lb (83.5 kg)     GEN:  Well nourished, well developed in no acute distress HEENT: Normal NECK: No JVD; No  carotid bruits LYMPHATICS: No lymphadenopathy CARDIAC: RRR, no murmurs, no rubs, no gallops RESPIRATORY:  Clear to auscultation without rales, wheezing or rhonchi  ABDOMEN: Soft, non-tender, non-distended MUSCULOSKELETAL:  No edema; No deformity  SKIN: Warm and dry LOWER EXTREMITIES: no swelling NEUROLOGIC:  Alert and oriented x 3 PSYCHIATRIC:  Normal affect   ASSESSMENT:    1. Essential hypertension   2. Supraventricular tachycardia   3. Dyslipidemia   4. Palpitations    PLAN:    In order of problems listed above:  Essential hypertension still uncontrolled.  I will check Chem-7 today if Chem-7 is fine we will switch him from hydrochlorothiazide to spironolactone 12.5 daily and then Chem-7 need to be done few days later.  He may end up on the combination of hydrochlorothiazide and spironolactone we will see how blood pressure react to this maneuver.  If not there is still some room to work with minoxidil Supraventricular tachycardia denies have any palpitations. Dyslipidemia did review K PN which show me data from June 23, 2022 with LDL 74 HDL 61.  Will continue present management   Medication Adjustments/Labs and Tests Ordered: Current medicines are reviewed at length with the patient today.  Concerns regarding medicines are outlined above.  No orders of the defined types were placed in this encounter.  Medication changes: No orders of the defined types were placed in this encounter.   Signed, Georgeanna Lea, MD, [facility] 12/07/2022 10:42 AM    [facility]

## 2022-12-07 NOTE — Addendum Note (Signed)
Addended by: Baldo Ash D on: 12/07/2022 10:49 AM   Modules accepted: Orders

## 2022-12-08 LAB — BASIC METABOLIC PANEL
BUN/Creatinine Ratio: 15 (ref 10–24)
BUN: 13 mg/dL (ref 8–27)
CO2: 22 mmol/L (ref 20–29)
Calcium: 9.2 mg/dL (ref 8.6–10.2)
Chloride: 103 mmol/L (ref 96–106)
Creatinine, Ser: 0.89 mg/dL (ref 0.76–1.27)
Glucose: 104 mg/dL — ABNORMAL HIGH (ref 70–99)
Potassium: 4 mmol/L (ref 3.5–5.2)
Sodium: 141 mmol/L (ref 134–144)
eGFR: 89 mL/min/{1.73_m2} (ref >59)

## 2022-12-09 ENCOUNTER — Telehealth: Payer: Self-pay

## 2022-12-09 DIAGNOSIS — I10 Essential (primary) hypertension: Secondary | ICD-10-CM

## 2022-12-09 DIAGNOSIS — Z79899 Other long term (current) drug therapy: Secondary | ICD-10-CM

## 2022-12-09 MED ORDER — SPIRONOLACTONE 25 MG PO TABS: 12.5000 mg | ORAL_TABLET | Freq: Every day | ORAL | 2 refills | Status: DC

## 2022-12-09 NOTE — Telephone Encounter (Signed)
Patient notified of results and recommendations and agreed with plan, medication sent, lab order on file.   

## 2022-12-09 NOTE — Telephone Encounter (Signed)
-----   Message from Georgeanna Lea, MD sent at 12/09/2022 10:02 AM EDT ----- Stop hydrochlorothiazide and start Aldactone 12.5 daily, he need to have Chem-7 done in 1 week

## 2022-12-21 LAB — BASIC METABOLIC PANEL
BUN/Creatinine Ratio: 14 (ref 10–24)
BUN: 14 mg/dL (ref 8–27)
CO2: 22 mmol/L (ref 20–29)
Calcium: 9.2 mg/dL (ref 8.6–10.2)
Chloride: 105 mmol/L (ref 96–106)
Creatinine, Ser: 0.97 mg/dL (ref 0.76–1.27)
Glucose: 96 mg/dL (ref 70–99)
Potassium: 4.1 mmol/L (ref 3.5–5.2)
Sodium: 141 mmol/L (ref 134–144)
eGFR: 81 mL/min/{1.73_m2} (ref >59)

## 2022-12-28 ENCOUNTER — Encounter: Payer: Self-pay | Admitting: Cardiology

## 2023-01-19 ENCOUNTER — Encounter: Payer: Self-pay | Admitting: Cardiology

## 2023-01-19 ENCOUNTER — Other Ambulatory Visit: Payer: Self-pay

## 2023-01-19 MED ORDER — SPIRONOLACTONE 25 MG PO TABS: 12.5000 mg | ORAL_TABLET | Freq: Every day | ORAL | 3 refills | Status: DC

## 2023-02-28 ENCOUNTER — Other Ambulatory Visit: Payer: Self-pay | Admitting: Cardiology

## 2023-03-02 ENCOUNTER — Other Ambulatory Visit: Payer: Self-pay | Admitting: Cardiology

## 2023-06-13 ENCOUNTER — Other Ambulatory Visit: Payer: Self-pay | Admitting: Cardiology

## 2023-06-26 ENCOUNTER — Encounter: Payer: Self-pay | Admitting: Cardiology

## 2023-06-27 MED ORDER — ROSUVASTATIN CALCIUM 10 MG PO TABS: 10.0000 mg | ORAL_TABLET | Freq: Every day | ORAL | 1 refills | Status: DC

## 2023-07-15 ENCOUNTER — Other Ambulatory Visit: Payer: Self-pay | Admitting: Cardiology

## 2023-07-18 ENCOUNTER — Other Ambulatory Visit: Payer: Self-pay | Admitting: Cardiology

## 2023-07-18 MED ORDER — SPIRONOLACTONE 25 MG PO TABS: 12.5000 mg | ORAL_TABLET | Freq: Every day | ORAL | 0 refills | Status: DC

## 2023-07-25 ENCOUNTER — Other Ambulatory Visit: Payer: Self-pay | Admitting: Cardiology

## 2023-07-26 ENCOUNTER — Encounter: Payer: Self-pay | Admitting: Cardiology

## 2023-07-26 ENCOUNTER — Ambulatory Visit: Payer: Medicare Other | Attending: Cardiology | Admitting: Cardiology

## 2023-07-26 VITALS — BP 152/80 | HR 64 | Ht 70.0 in | Wt 188.0 lb

## 2023-07-26 DIAGNOSIS — I10 Essential (primary) hypertension: Secondary | ICD-10-CM

## 2023-07-26 DIAGNOSIS — E785 Hyperlipidemia, unspecified: Secondary | ICD-10-CM

## 2023-07-26 DIAGNOSIS — I471 Supraventricular tachycardia, unspecified: Secondary | ICD-10-CM

## 2023-07-26 DIAGNOSIS — R002 Palpitations: Secondary | ICD-10-CM | POA: Diagnosis not present

## 2023-07-26 MED ORDER — MINOXIDIL 10 MG PO TABS: 20.0000 mg | ORAL_TABLET | Freq: Every day | ORAL | 1 refills | Status: DC

## 2023-07-26 NOTE — Addendum Note (Signed)
 Addended by: Baldo Ash D on: 07/26/2023 03:09 PM   Modules accepted: Orders

## 2023-07-26 NOTE — Patient Instructions (Addendum)
 Medication Instructions:   INCREASE: Minoxidil  to 20mg  daily   Lab Work: 3rd Floor   [address]  Your physician recommends that you return for lab work in:   1 week You need to have labs done when you are fasting.  You can come Monday through Friday 8:00 am to 11:30AM and 1:00 to 4:00. You do not need to make an appointment as the order has already been placed.   Testing/Procedures: None Ordered   Follow-Up: At [facility], you and your health needs are our priority.  As part of our continuing mission to provide you with exceptional heart care, we have created designated Provider Care Teams.  These Care Teams include your primary Cardiologist (physician) and Advanced Practice Providers (APPs -  Physician Assistants and Nurse Practitioners) who all work together to provide you with the care you need, when you need it.  We recommend signing up for the patient portal called MyChart.  Sign up information is provided on this After Visit Summary.  MyChart is used to connect with patients for Virtual Visits (Telemedicine).  Patients are able to view lab/test results, encounter notes, upcoming appointments, etc.  Non-urgent messages can be sent to your provider as well.   To learn more about what you can do with MyChart, go to [email or web address].    Your next appointment:   6 month(s)  The format for your next appointment:   In Person  Provider:   Lamar Fitch, MD    Other Instructions NA

## 2023-07-26 NOTE — Progress Notes (Signed)
 Cardiology Office Note:    Date:  07/26/2023   ID:  Zachary Barton, DOB [birth year], MRN [id number]  PCP:  Patient, No Pcp Per  Cardiologist:  Lamar Fitch, MD    Referring MD: No ref. provider found   Chief Complaint  Patient presents with   Follow-up    History of Present Illness:    Zachary Barton is a 78 y.o. male past medical history significant for essential hypertension, supraventricular tachycardia, dyspnea on exertion.  Comes today to months for follow-up overall doing very well.  Denies have any chest pain tightness squeezing pressure burning chest.  Trying to be active have no difficulty.  Past Medical History:  Diagnosis Date   Dyslipidemia 07/09/2020   Dyspnea on exertion 05/28/2020   Essential hypertension 05/28/2020   Knee pain    Osteoarthritis of left patellofemoral joint    Palpitations    Supraventricular tachycardia (HCC) 07/09/2020    Past Surgical History:  Procedure Laterality Date   CATARACT EXTRACTION, BILATERAL  2years ago   HERNIA REPAIR  25 years ago    Current Medications: Current Meds  Medication Sig   aspirin 81 MG EC tablet Take 81 mg by mouth every other day.   Glucosamine Sulfate 1000 MG TABS Take 1,000 mg by mouth daily.   losartan  (COZAAR ) 100 MG tablet TAKE 1 TABLET(100 MG) BY MOUTH DAILY (Patient taking differently: Take 100 mg by mouth daily.)   metoprolol  succinate (TOPROL -XL) 50 MG 24 hr tablet Take 1 tablet (50 mg total) by mouth daily.   minoxidil  (LONITEN ) 10 MG tablet Take 1.5 tablets (15 mg total) by mouth daily. TAKE 1 TABLET(10 MG) BY MOUTH DAILY   rosuvastatin  (CRESTOR ) 10 MG tablet Take 1 tablet (10 mg total) by mouth daily.   spironolactone  (ALDACTONE ) 25 MG tablet Take 0.5 tablets (12.5 mg total) by mouth daily. Stop the Hydrochlorothiazide      Allergies:   Patient has no known allergies.   Social History   Socioeconomic History   Marital status: Married    Spouse name: Not on file   Number of children: Not  on file   Years of education: Not on file   Highest education level: Not on file  Occupational History   Not on file  Tobacco Use   Smoking status: Former   Smokeless tobacco: Never  Substance and Sexual Activity   Alcohol use: Never   Drug use: Never   Sexual activity: Not on file  Other Topics Concern   Not on file  Social History Narrative   Not on file   Social Drivers of Health   Financial Resource Strain: Not on file  Food Insecurity: Not on file  Transportation Needs: Not on file  Physical Activity: Not on file  Stress: Not on file  Social Connections: Not on file     Family History: The patient's family history includes Atrial fibrillation in his brother; Cancer in his mother and paternal grandfather; Cancer - Other in his brother and father; Diabetes in his sister; Heart attack in his brother and maternal grandfather; Hypertension in his mother; Thyroid  disease in his mother. ROS:   Please see the history of present illness.    All 14 point review of systems negative except as described per history of present illness  EKGs/Labs/Other Studies Reviewed:    EKG Interpretation Date/Time:  Tuesday July 26 2023 14:21:31 EST Ventricular Rate:  63 PR Interval:  172 QRS Duration:  90 QT Interval:  428 QTC Calculation: 437 R Axis:  47  Text Interpretation: Normal sinus rhythm Normal ECG When compared with ECG of 23-May-2020 10:42, PREVIOUS ECG IS PRESENT Confirmed by Bernie Charleston [phone]) on 07/26/2023 2:35:15 PM    Recent Labs: 12/20/2022: BUN 14; Creatinine, Ser 0.97; Potassium 4.1; Sodium 141  Recent Lipid Panel    Component Value Date/Time   CHOL 148 06/23/2022 0900   TRIG 67 06/23/2022 0900   HDL 61 06/23/2022 0900   CHOLHDL 2.4 06/23/2022 0900   LDLCALC 74 06/23/2022 0900    Physical Exam:    VS:  BP (!) 152/80 (BP Location: Right Arm, Patient Position: Sitting)   Pulse 64   Ht 5' 10 (1.778 m)   Wt 188 lb (85.3 kg)   SpO2 98%   BMI 26.98  kg/m     Wt Readings from Last 3 Encounters:  07/26/23 188 lb (85.3 kg)  12/07/22 180 lb (81.6 kg)  06/15/22 181 lb (82.1 kg)     GEN:  Well nourished, well developed in no acute distress HEENT: Normal NECK: No JVD; No carotid bruits LYMPHATICS: No lymphadenopathy CARDIAC: RRR, no murmurs, no rubs, no gallops RESPIRATORY:  Clear to auscultation without rales, wheezing or rhonchi  ABDOMEN: Soft, non-tender, non-distended MUSCULOSKELETAL:  No edema; No deformity  SKIN: Warm and dry LOWER EXTREMITIES: no swelling NEUROLOGIC:  Alert and oriented x 3 PSYCHIATRIC:  Normal affect   ASSESSMENT:    1. Essential hypertension   2. Supraventricular tachycardia (HCC)   3. Dyslipidemia   4. Palpitations    PLAN:    In order of problems listed above:  Essential hypertension still uncontrolled, I will increase dose of minoxidil  to 20 mg daily, Chem-7 need to be checked next week. Supraventricular tachycardia denies having any palpitations. Dyslipidemia I did review K PN show me LDL 74 HDL 61 good control continue present management   Medication Adjustments/Labs and Tests Ordered: Current medicines are reviewed at length with the patient today.  Concerns regarding medicines are outlined above.  Orders Placed This Encounter  Procedures   EKG 12-Lead   Medication changes: No orders of the defined types were placed in this encounter.   Signed, [place] DOROTHA Bernie, MD, [facility] 07/26/2023 2:48 PM    [facility]

## 2023-08-05 LAB — BASIC METABOLIC PANEL
BUN/Creatinine Ratio: 11 (ref 10–24)
BUN: 12 mg/dL (ref 8–27)
CO2: 23 mmol/L (ref 20–29)
Calcium: 9.2 mg/dL (ref 8.6–10.2)
Chloride: 105 mmol/L (ref 96–106)
Creatinine, Ser: 1.05 mg/dL (ref 0.76–1.27)
Glucose: 96 mg/dL (ref 70–99)
Potassium: 4.4 mmol/L (ref 3.5–5.2)
Sodium: 143 mmol/L (ref 134–144)
eGFR: 74 mL/min/{1.73_m2} (ref >59)

## 2023-08-17 ENCOUNTER — Telehealth: Payer: Self-pay

## 2023-08-17 NOTE — Telephone Encounter (Signed)
 Left message on My Chart with normal lab results per Dr. Vanetta Shawl note. Routed to PCP.

## 2023-08-18 ENCOUNTER — Telehealth: Payer: Self-pay

## 2023-08-18 NOTE — Telephone Encounter (Signed)
 LVM per DPR- per Dr. Vanetta Shawl note regarding normal lab results. Encouraged to call with any questions. Routed to PCP.

## 2023-08-22 ENCOUNTER — Encounter: Payer: Self-pay | Admitting: Cardiology

## 2023-08-22 ENCOUNTER — Other Ambulatory Visit: Payer: Self-pay

## 2023-08-22 ENCOUNTER — Other Ambulatory Visit: Payer: Self-pay | Admitting: Cardiology

## 2023-08-22 NOTE — Telephone Encounter (Signed)
 Opened in error

## 2023-09-20 ENCOUNTER — Encounter: Payer: Self-pay | Admitting: Cardiology

## 2023-09-20 MED ORDER — LOSARTAN POTASSIUM 100 MG PO TABS: 100.0000 mg | ORAL_TABLET | Freq: Every day | ORAL | 1 refills | Status: DC

## 2023-09-20 MED ORDER — ROSUVASTATIN CALCIUM 10 MG PO TABS: 10.0000 mg | ORAL_TABLET | Freq: Every day | ORAL | 1 refills | Status: DC

## 2023-10-26 ENCOUNTER — Other Ambulatory Visit: Payer: Self-pay | Admitting: Cardiology

## 2023-10-30 ENCOUNTER — Encounter (HOSPITAL_BASED_OUTPATIENT_CLINIC_OR_DEPARTMENT_OTHER): Payer: Self-pay | Admitting: Emergency Medicine

## 2023-10-30 ENCOUNTER — Emergency Department (HOSPITAL_BASED_OUTPATIENT_CLINIC_OR_DEPARTMENT_OTHER)
Admission: EM | Admit: 2023-10-30 | Discharge: 2023-10-30 | Disposition: A | Discharge status: Home / Self Care | Attending: Emergency Medicine | Admitting: Emergency Medicine

## 2023-10-30 ENCOUNTER — Other Ambulatory Visit: Payer: Self-pay

## 2023-10-30 ENCOUNTER — Emergency Department (HOSPITAL_BASED_OUTPATIENT_CLINIC_OR_DEPARTMENT_OTHER)

## 2023-10-30 DIAGNOSIS — Z7982 Long term (current) use of aspirin: Secondary | ICD-10-CM | POA: Insufficient documentation

## 2023-10-30 DIAGNOSIS — N281 Cyst of kidney, acquired: Secondary | ICD-10-CM | POA: Insufficient documentation

## 2023-10-30 DIAGNOSIS — R319 Hematuria, unspecified: Secondary | ICD-10-CM

## 2023-10-30 DIAGNOSIS — N401 Enlarged prostate with lower urinary tract symptoms: Secondary | ICD-10-CM | POA: Insufficient documentation

## 2023-10-30 DIAGNOSIS — N400 Enlarged prostate without lower urinary tract symptoms: Secondary | ICD-10-CM

## 2023-10-30 LAB — URINALYSIS, ROUTINE W REFLEX MICROSCOPIC
Bilirubin Urine: NEGATIVE
Glucose, UA: NEGATIVE mg/dL
Ketones, ur: NEGATIVE mg/dL
Leukocytes,Ua: NEGATIVE
Nitrite: NEGATIVE
Protein, ur: 100 mg/dL — AB
Specific Gravity, Urine: 1.01 (ref 1.005–1.030)
pH: 5.5 (ref 5.0–8.0)

## 2023-10-30 LAB — URINALYSIS, MICROSCOPIC (REFLEX): RBC / HPF: >50 RBC/hpf (ref 0–5)

## 2023-10-30 NOTE — ED Provider Notes (Signed)
 [facility] Provider Note   CSN: [id number] Arrival date & time: 10/30/23  1326     History  Chief Complaint  Patient presents with   Hematuria    Zachary Barton is a 78 y.o. male.  78 yo M with a chief complaints of blood in the urine.  Patient said that she has had 3 urinary events today and has noticed clots in his urine.  He had something similar on Friday but it resolved spontaneously.  Had had some left lower back pain that he thought was due to working in the yard.  He denies fevers or chills denies nausea or vomiting.   Hematuria       Home Medications Prior to Admission medications   Medication Sig Start Date End Date Taking? Authorizing Provider  aspirin 81 MG EC tablet Take 81 mg by mouth every other day.    [provider]  Glucosamine Sulfate 1000 MG TABS Take 1,000 mg by mouth daily.    [provider]  losartan  (COZAAR ) 100 MG tablet Take 1 tablet (100 mg total) by mouth daily. TAKE 1 TABLET(100 MG) BY MOUTH DAILY 09/20/23   Krasowski, Robert J, MD  metoprolol  succinate (TOPROL -XL) 50 MG 24 hr tablet Take 1 tablet (50 mg total) by mouth daily. 07/15/23   Krasowski, Robert J, MD  minoxidil  (LONITEN ) 10 MG tablet TAKE 2 TABLETS(20 MG) BY MOUTH DAILY 10/26/23   Krasowski, Robert J, MD  rosuvastatin  (CRESTOR ) 10 MG tablet Take 1 tablet (10 mg total) by mouth daily. 09/20/23   Krasowski, Robert J, MD  spironolactone  (ALDACTONE ) 25 MG tablet Take 0.5 tablets (12.5 mg total) by mouth daily. Stop the Hydrochlorothiazide  07/18/23 10/16/23  Krasowski, Robert J, MD      Allergies    Patient has no known allergies.    Review of Systems   Review of Systems  Genitourinary:  Positive for hematuria.    Physical Exam Updated Vital Signs BP (!) 161/79   Pulse 67   Temp 98.5 F (36.9 C)   Resp 18   Ht 5\' 10"  (1.778 m)   Wt 85.3 kg   SpO2 98%   BMI 26.98 kg/m  Physical Exam Vitals and nursing note reviewed.   Constitutional:      Appearance: He is well-developed.  HENT:     Head: Normocephalic and atraumatic.  Eyes:     Pupils: Pupils are equal, round, and reactive to light.  Neck:     Vascular: No JVD.  Cardiovascular:     Rate and Rhythm: Normal rate and regular rhythm.     Heart sounds: No murmur heard.    No friction rub. No gallop.  Pulmonary:     Effort: No respiratory distress.     Breath sounds: No wheezing.  Abdominal:     General: There is no distension.     Tenderness: There is no abdominal tenderness. There is no guarding or rebound.  Musculoskeletal:        General: Normal range of motion.     Cervical back: Normal range of motion and neck supple.  Skin:    Coloration: Skin is not pale.     Findings: No rash.  Neurological:     Mental Status: He is alert and oriented to person, place, and time.  Psychiatric:        Behavior: Behavior normal.     ED Results / Procedures / Treatments   Labs (all labs ordered are  listed, but only abnormal results are displayed) Labs Reviewed  URINALYSIS, ROUTINE W REFLEX MICROSCOPIC    EKG None  Radiology No results found.  Procedures Procedures    Medications Ordered in ED Medications - No data to display  ED Course/ Medical Decision Making/ A&P                                 Medical Decision Making Amount and/or Complexity of Data Reviewed Labs: ordered. Radiology: ordered.   78 yo M with a chief complaints of hematuria.  Had 1 episode a couple days ago.  Now had multiple urinary events this morning.  Having some left flank pain as well.  Will obtain a CT stone study.  UA.  Reassess.  UA without obvious infection.  Awaiting CT read.  Signed out to Dr. Gordon Latus, please see their note for further details of care in the ED.  The patients results and plan were reviewed and discussed.   Any x-rays performed were independently reviewed by myself.   Differential diagnosis were considered with the presenting  HPI.  Medications - No data to display  Vitals:   10/30/23 1331 10/30/23 1332  BP:  (!) 161/79  Pulse:  67  Resp:  18  Temp:  98.5 F (36.9 C)  SpO2:  98%  Weight: 85.3 kg   Height: 5\' 10"  (1.778 m)     Final diagnoses:  Hematuria, unspecified type  Renal cyst  Enlarged prostate            Final Clinical Impression(s) / ED Diagnoses Final diagnoses:  None    Rx / DC Orders ED Discharge Orders     None         Albertus Hughs, DO 10/30/23 1944

## 2023-10-30 NOTE — ED Triage Notes (Signed)
 Pt reports hematuria x 3 this morning; denies pain; no recent urological procedures

## 2023-10-30 NOTE — Discharge Instructions (Signed)
 Please call to schedule follow-up appointment with the urologist.  You need further follow-up for the blood in your urine.  Your prostate was enlarged on the CT scan.  This may potentially be a sign of prostate cancer.  You should follow-up with a specialist for this issue.

## 2023-10-30 NOTE — ED Provider Notes (Signed)
 78  yo male here with hematuria for several days Ua + hgb, no clear evidence of infection  Physical Exam  BP (!) 161/79   Pulse 67   Temp 98.5 F (36.9 C)   Resp 18   Ht 5\' 10"  (1.778 m)   Wt 85.3 kg   SpO2 98%   BMI 26.98 kg/m   Physical Exam  Procedures  Procedures  ED Course / MDM    Medical Decision Making Amount and/or Complexity of Data Reviewed Labs: ordered. Radiology: ordered.   CT imaging reviewed with enlarged prostate, incidental renal cyst noted.  No other emergent findings.  Patient reassessed and is currently asymptomatic.  Feels that his urine has "cleared up" since he has been here.  I did explain my concerns for an enlarged prostate which may be a sign of prostate cancer, bladder cancer is also on the differential still with the painless hematuria.  I emphasized the need for follow-up with a urologist and the patient and his wife verbalized understanding.  I do not see indication for hospitalization or blood testing at this time.  He is stable for discharge.  UA does not show any evidence of infection and he is not having any dysuria, and so there is no indication for antibiotics at this time       Arvilla Birmingham, MD 10/30/23 [phone]

## 2023-10-31 ENCOUNTER — Encounter: Payer: Self-pay | Admitting: Cardiology

## 2023-11-21 ENCOUNTER — Other Ambulatory Visit: Payer: Self-pay

## 2023-11-21 MED ORDER — METOPROLOL SUCCINATE ER 50 MG PO TB24: 50.0000 mg | ORAL_TABLET | Freq: Every day | ORAL | 0 refills | Status: DC

## 2024-01-11 ENCOUNTER — Encounter: Payer: Self-pay | Admitting: Cardiology

## 2024-01-12 ENCOUNTER — Telehealth: Payer: Self-pay

## 2024-01-12 DIAGNOSIS — R002 Palpitations: Secondary | ICD-10-CM

## 2024-01-12 NOTE — Telephone Encounter (Signed)
 Zio per Dr. Karry note

## 2024-01-16 ENCOUNTER — Other Ambulatory Visit: Payer: Self-pay | Admitting: Urology

## 2024-01-16 DIAGNOSIS — N401 Enlarged prostate with lower urinary tract symptoms: Secondary | ICD-10-CM

## 2024-01-17 ENCOUNTER — Ambulatory Visit

## 2024-01-17 ENCOUNTER — Ambulatory Visit: Attending: Cardiology

## 2024-02-15 ENCOUNTER — Encounter: Payer: Self-pay | Admitting: Cardiology

## 2024-02-16 ENCOUNTER — Other Ambulatory Visit: Payer: Self-pay

## 2024-02-16 MED ORDER — MINOXIDIL 10 MG PO TABS: 10.0000 mg | ORAL_TABLET | Freq: Every day | ORAL | 3 refills | Status: DC

## 2024-02-18 ENCOUNTER — Other Ambulatory Visit: Payer: Self-pay | Admitting: Cardiology

## 2024-02-21 ENCOUNTER — Ambulatory Visit: Payer: Self-pay | Admitting: Cardiology

## 2024-02-21 DIAGNOSIS — R002 Palpitations: Secondary | ICD-10-CM | POA: Diagnosis not present

## 2024-02-22 ENCOUNTER — Encounter: Payer: Self-pay | Admitting: Cardiology

## 2024-02-22 ENCOUNTER — Ambulatory Visit: Attending: Cardiology | Admitting: Cardiology

## 2024-02-22 VITALS — BP 148/88 | HR 58 | Ht 70.0 in | Wt 180.4 lb

## 2024-02-22 DIAGNOSIS — E785 Hyperlipidemia, unspecified: Secondary | ICD-10-CM | POA: Diagnosis not present

## 2024-02-22 DIAGNOSIS — R0609 Other forms of dyspnea: Secondary | ICD-10-CM

## 2024-02-22 DIAGNOSIS — R002 Palpitations: Secondary | ICD-10-CM | POA: Diagnosis not present

## 2024-02-22 DIAGNOSIS — I471 Supraventricular tachycardia, unspecified: Secondary | ICD-10-CM | POA: Diagnosis not present

## 2024-02-22 DIAGNOSIS — I10 Essential (primary) hypertension: Secondary | ICD-10-CM | POA: Diagnosis not present

## 2024-02-22 MED ORDER — METOPROLOL SUCCINATE ER 50 MG PO TB24: 75.0000 mg | ORAL_TABLET | Freq: Every day | ORAL | 3 refills | Status: AC

## 2024-02-22 NOTE — Patient Instructions (Addendum)
 Medication Instructions:   INCREASE: Metoprolol  Succinate 75mg  1 tablet daily   Lab Work: None Ordered If you have labs (blood work) drawn today and your tests are completely normal, you will receive your results only by: MyChart Message (if you have MyChart) OR A paper copy in the mail If you have any lab test that is abnormal or we need to change your treatment, we will call you to review the results.   Testing/Procedures: Your physician has requested that you have an echocardiogram. Echocardiography is a painless test that uses sound waves to create images of your heart. It provides your doctor with information about the size and shape of your heart and how well your heart's chambers and valves are working. This procedure takes approximately one hour. There are no restrictions for this procedure. Please do NOT wear cologne, perfume, aftershave, or lotions (deodorant is allowed). Please arrive 15 minutes prior to your appointment time.  Please note: We ask at that you not bring children with you during ultrasound (echo/ vascular) testing. Due to room size and safety concerns, children are not allowed in the ultrasound rooms during exams. Our front office staff cannot provide observation of children in our lobby area while testing is being conducted. An adult accompanying a patient to their appointment will only be allowed in the ultrasound room at the discretion of the ultrasound technician under special circumstances. We apologize for any inconvenience.    Follow-Up: At [facility], you and your health needs are our priority.  As part of our continuing mission to provide you with exceptional heart care, we have created designated Provider Care Teams.  These Care Teams include your primary Cardiologist (physician) and Advanced Practice Providers (APPs -  Physician Assistants and Nurse Practitioners) who all work together to provide you with the care you need, when you need it.  We  recommend signing up for the patient portal called MyChart.  Sign up information is provided on this After Visit Summary.  MyChart is used to connect with patients for Virtual Visits (Telemedicine).  Patients are able to view lab/test results, encounter notes, upcoming appointments, etc.  Non-urgent messages can be sent to your provider as well.   To learn more about what you can do with MyChart, go to [email or web address].    Your next appointment:   6 month(s)  The format for your next appointment:   In Person  Provider:   Lamar Fitch, MD    Other Instructions NA

## 2024-02-22 NOTE — Addendum Note (Signed)
 Addended by: ARLOA PLANAS D on: 02/22/2024 09:29 AM   Modules accepted: Orders

## 2024-02-22 NOTE — Progress Notes (Signed)
 Cardiology Office Note:    Date:  02/22/2024   ID:  Zachary Barton, DOB 10/06/1945, MRN [id number]  PCP:  Patient, No Pcp Per  Cardiologist:  Lamar Fitch, MD    Referring MD: No ref. provider found   No chief complaint on file.   History of Present Illness:    Zachary Barton is a 78 y.o. male past medical history significant for essential hypertension, supraventricular tachycardia, dyspnea on exertion.  Comes today to my office recently started complaining of having more palpitations, he did wear a monitor which showed more than 6000 episode of supraventricular tachycardia.  He comes to talk about it.  Overall he is doing much better.  He thinks that his tachycardia was related to stress at home he did have some remodeling done at his house but was of course very stressful but now he is doing fine and he said tachycardia/palpitations are much improved.  No chest pain tightness squeezing pressure burning chest he does what he wants to do  Past Medical History:  Diagnosis Date   Dyslipidemia 07/09/2020   Dyspnea on exertion 05/28/2020   Essential hypertension 05/28/2020   Knee pain    Osteoarthritis of left patellofemoral joint    Palpitations    Supraventricular tachycardia (HCC) 07/09/2020    Past Surgical History:  Procedure Laterality Date   CATARACT EXTRACTION, BILATERAL  2years ago   HERNIA REPAIR  25 years ago    Current Medications: Current Meds  Medication Sig   aspirin 81 MG EC tablet Take 81 mg by mouth every other day.   Glucosamine Sulfate 1000 MG TABS Take 1,000 mg by mouth daily.   losartan  (COZAAR ) 100 MG tablet Take 1 tablet (100 mg total) by mouth daily. TAKE 1 TABLET(100 MG) BY MOUTH DAILY   metoprolol  succinate (TOPROL -XL) 50 MG 24 hr tablet TAKE 1 TABLET(50 MG) BY MOUTH DAILY   minoxidil  (LONITEN ) 10 MG tablet Take 1 tablet (10 mg total) by mouth daily.   rosuvastatin  (CRESTOR ) 10 MG tablet Take 1 tablet (10 mg total) by mouth daily.    spironolactone  (ALDACTONE ) 25 MG tablet Take 0.5 tablets (12.5 mg total) by mouth daily. Stop the Hydrochlorothiazide      Allergies:   Patient has no known allergies.   Social History   Socioeconomic History   Marital status: Married    Spouse name: Not on file   Number of children: Not on file   Years of education: Not on file   Highest education level: Not on file  Occupational History   Not on file  Tobacco Use   Smoking status: Former   Smokeless tobacco: Never  Substance and Sexual Activity   Alcohol use: Never   Drug use: Never   Sexual activity: Not on file  Other Topics Concern   Not on file  Social History Narrative   Not on file   Social Drivers of Health   Financial Resource Strain: Not on file  Food Insecurity: Not on file  Transportation Needs: Not on file  Physical Activity: Not on file  Stress: Not on file  Social Connections: Not on file     Family History: The patient's family history includes Atrial fibrillation in his brother; Cancer in his mother and paternal grandfather; Cancer - Other in his brother and father; Diabetes in his sister; Heart attack in his brother and maternal grandfather; Hypertension in his mother; Thyroid  disease in his mother. ROS:   Please see the history of present illness.  All 14 point review of systems negative except as described per history of present illness  EKGs/Labs/Other Studies Reviewed:         Recent Labs: 08/04/2023: BUN 12; Creatinine, Ser 1.05; Potassium 4.4; Sodium 143  Recent Lipid Panel    Component Value Date/Time   CHOL 148 06/23/2022 0900   TRIG 67 06/23/2022 0900   HDL 61 06/23/2022 0900   CHOLHDL 2.4 06/23/2022 0900   LDLCALC 74 06/23/2022 0900    Physical Exam:    VS:  BP (!) 148/88   Pulse (!) 58   Ht 5' 10 (1.778 m)   Wt 180 lb 6.4 oz (81.8 kg)   SpO2 97%   BMI 25.88 kg/m     Wt Readings from Last 3 Encounters:  02/22/24 180 lb 6.4 oz (81.8 kg)  10/30/23 188 lb 0.8 oz (85.3  kg)  07/26/23 188 lb (85.3 kg)     GEN:  Well nourished, well developed in no acute distress HEENT: Normal NECK: No JVD; No carotid bruits LYMPHATICS: No lymphadenopathy CARDIAC: RRR, no murmurs, no rubs, no gallops RESPIRATORY:  Clear to auscultation without rales, wheezing or rhonchi  ABDOMEN: Soft, non-tender, non-distended MUSCULOSKELETAL:  No edema; No deformity  SKIN: Warm and dry LOWER EXTREMITIES: no swelling NEUROLOGIC:  Alert and oriented x 3 PSYCHIATRIC:  Normal affect   ASSESSMENT:    1. Supraventricular tachycardia (HCC)   2. Essential hypertension   3. Dyslipidemia   4. Palpitations    PLAN:    In order of problems listed above:  Supraventricular tachycardia I will ask him to increase dose of metoprolol  from 50 mg to 75 mg daily.  I warned him about potential side effect including bradycardia and dizziness weakness fatigue I told him if that happen he did go back to 50 mg daily.  I will ask him to have an echocardiogram done to assess left ventricular ejection fraction. Essential hypertension blood pressure 148/88 today but he said at home it is always good.  Will continue monitoring, hopefully will be better with increasing dose of metoprolol . Dyslipidemia I did review his K PN which show me his LDL of 74 HDL 61 this is from December will continue present management   Medication Adjustments/Labs and Tests Ordered: Current medicines are reviewed at length with the patient today.  Concerns regarding medicines are outlined above.  No orders of the defined types were placed in this encounter.  Medication changes: No orders of the defined types were placed in this encounter.   Signed, Lamar DOROTHA Fitch, MD, [facility] 02/22/2024 9:19 AM    [facility]

## 2024-02-27 ENCOUNTER — Ambulatory Visit
Admission: RE | Admit: 2024-02-27 | Discharge: 2024-02-27 | Disposition: A | Discharge status: Home / Self Care | Source: Ambulatory Visit | Attending: Urology | Admitting: Urology

## 2024-02-27 DIAGNOSIS — N401 Enlarged prostate with lower urinary tract symptoms: Secondary | ICD-10-CM

## 2024-02-27 HISTORY — PX: IR RADIOLOGIST EVAL & MGMT: IMG5224

## 2024-02-27 NOTE — Progress Notes (Signed)
 Reason for visit: BPH with LUTS. Prostate embolization.   Care Team(s) Primary Care: Patient, No Pcp Per Cardiology: Bernie Lamar PARAS, MD Urology: Elisabeth Valli BIRCH MD  History of Present Illness:  Mr. Zachary Barton is a 78 y.o. male w PMHx of SVT and HTN. Pt reports Hx of gross hematuria and BPH w moderate LUTS. He is on a diuretic and endorses frequent urination. He denies prior catherization or nocturia. He was seen by urology and underwent cyystoscopy which demonstrated a hypervascular enlarged prostate. No alpha blockade. He was referred for evaluation for potential embolization.  I used a International Prostatism Symptom Score* (IPSS) Score to quantify his symptoms : 23 points (Severe symptoms)   *Wadie MJ, Ashley GARNELL Mickey Valorie MP, et al. The American Urological Association symptom index for benign prostatic hyperplasia. The Measurement Committee of the American Urological Association. J Urol [id number]; 851:8450.   Review of Systems: A 12 point ROS discussed and pertinent positives are indicated in the HPI above.  All other systems are negative.  Past Medical History:  Diagnosis Date   Dyslipidemia 07/09/2020   Dyspnea on exertion 05/28/2020   Essential hypertension 05/28/2020   Knee pain    Osteoarthritis of left patellofemoral joint    Palpitations    Supraventricular tachycardia (HCC) 07/09/2020    Past Surgical History:  Procedure Laterality Date   CATARACT EXTRACTION, BILATERAL  2years ago   HERNIA REPAIR  25 years ago   IR RADIOLOGIST EVAL & MGMT  02/27/2024    Allergies: Patient has no known allergies.  Medications: Prior to Admission medications   Medication Sig Start Date End Date Taking? Authorizing Provider  aspirin 81 MG EC tablet Take 81 mg by mouth every other day.    [provider]  Glucosamine Sulfate 1000 MG TABS Take 1,000 mg by mouth daily.    [provider]  losartan  (COZAAR ) 100 MG tablet Take 1 tablet (100 mg total) by  mouth daily. TAKE 1 TABLET(100 MG) BY MOUTH DAILY 09/20/23   Krasowski, Robert J, MD  metoprolol  succinate (TOPROL -XL) 50 MG 24 hr tablet Take 1.5 tablets (75 mg total) by mouth daily. Take with or immediately following a meal. 02/22/24   Krasowski, Robert J, MD  minoxidil  (LONITEN ) 10 MG tablet Take 1 tablet (10 mg total) by mouth daily. 02/16/24   Krasowski, Robert J, MD  rosuvastatin  (CRESTOR ) 10 MG tablet Take 1 tablet (10 mg total) by mouth daily. 09/20/23   Krasowski, Robert J, MD  spironolactone  (ALDACTONE ) 25 MG tablet Take 0.5 tablets (12.5 mg total) by mouth daily. Stop the Hydrochlorothiazide  07/18/23 02/22/24  Bernie Lamar PARAS, MD     Family History  Problem Relation Age of Onset   Thyroid  disease Mother    Cancer Mother    Hypertension Mother    Cancer - Other Father    Diabetes Sister    Heart attack Brother    Heart attack Maternal Grandfather    Cancer Paternal Grandfather    Atrial fibrillation Brother    Cancer - Other Brother        kidney    Social History   Socioeconomic History   Marital status: Married    Spouse name: Not on file   Number of children: Not on file   Years of education: Not on file   Highest education level: Not on file  Occupational History   Not on file  Tobacco Use   Smoking status: Former   Smokeless tobacco: Never  Substance and  Sexual Activity   Alcohol use: Never   Drug use: Never   Sexual activity: Not on file  Other Topics Concern   Not on file  Social History Narrative   Not on file   Social Drivers of Health   Financial Resource Strain: Not on file  Food Insecurity: Not on file  Transportation Needs: Not on file  Physical Activity: Not on file  Stress: Not on file  Social Connections: Not on file    Review of Systems As above  Vital Signs: BP (!) 161/77 (BP Location: Left Arm, Patient Position: Sitting, Cuff Size: Normal)   Pulse 62   Temp 98 F (36.7 C)   Resp 16   SpO2 98%   Physical Exam  General: WN,  NAD  CV: RRR on monitor Pulm: normal work of breathing on RA Abd: S, ND, NT MSK: Grossly normal Psych: Appropriate affect.  Imaging:  Prostatomegaly, measures approximately 5.9 * 5.6 * 6.0 cm (~99 mL)    CT AP WO, 10/30/23 IMPRESSION:  1. Right renal cyst. 2. Prostatomegaly. 3. Aortic atherosclerosis.  4. No acute abdominal or pelvic pathology   IR Radiologist Eval & Mgmt Result Date: 02/27/2024 EXAM: NEW PATIENT OFFICE VISIT CHIEF COMPLAINT: See below HISTORY OF PRESENT ILLNESS: See below REVIEW OF SYSTEMS: See below PHYSICAL EXAMINATION: See below ASSESSMENT AND PLAN: Please refer to completed note in the electronic medical record on [place] Epic Thom Hall, MD Vascular and Interventional Radiology Specialists [facility] Radiology Electronically Signed   By: Thom Hall M.D.   On: 02/27/2024 11:49   LONG TERM MONITOR (3-14 DAYS) Result Date: 02/21/2024 Patch Wear Time:  13 days and 20 hours (2025-07-08T13:13:25-0400 to 2025-07-22T09:27:12-398) Patient had a min HR of 41 bpm, max HR of 214 bpm, and avg HR of 60 bpm. Predominant underlying rhythm was Sinus Rhythm. 6206 Supraventricular Tachycardia runs occurred, the run with the fastest interval lasting 13.1 secs with a max rate of 214 bpm, the longest lasting 23.7 secs with an avg rate of 144 bpm. Some episodes of Supraventricular Tachycardia may be possible Atrial Tachycardia with variable block. Supraventricular Tachycardia was detected within +/- 45 seconds of symptomatic patient event(s). Isolated SVEs were rare (<1.0%, [id number]), SVE Couplets were occasional (1.5%, 8998), and SVE Triplets were occasional (2.5%, 9950). Isolated VEs were rare (<1.0%, 1906), VE Couplets were rare (<1.0%, 777), and VE Triplets were rare (<1.0%, 152). Summary conclusions: Total of 6200 sick supraventricular tachycardia with the longest episode 23.7 seconds rate of 144.  Symptomatic. Occasional supraventricular triplets total burden of 2.5%.     Labs:  CBC: No results for input(s): WBC, HGB, HCT, PLT in the last 8760 hours.  COAGS: No results for input(s): INR, APTT in the last 8760 hours.  BMP: Recent Labs    08/04/23 0928  NA 143  K 4.4  CL 105  CO2 23  GLUCOSE 96  BUN 12  CALCIUM  9.2  CREATININE 1.05   Assessment and Plan:  78 y/o M w PMHx of SVT, HTN and BPH w moderate LUTS. Episodic gross hematuria w hypervascular, enlarged prostate on cystoscopy.  Prostatomegaly (~99 mL) PSA, last 3.1 (11/28/23) IPSS Score Total, 19 points (Moderate symptoms) QoL, mostly dissatisfied. No alpha blockade.   Given the nature of the disease, I felt that the patient would benefit from Prostate Artery Embolization (PAE).  The procedure has been fully reviewed with the patient/patient's authorized representative. The risks, benefits and alternatives of PAE were fully discussed including technique, potential complications, timeline  of improvement, efficacy and durability. The patient/patient's authorized representative has consented to the procedure.   *Will order CTA Pelvis for procedural planning *OK to proceed to schedule PAE on mutual availability.   *Same day procedure, no overnight admission. Procedure to be performed at [facility] *No ASA hold required *Pt habitus < 6 ft, will plan for trans-radial access. *Rocephin for pre op Abx *CBC, BMP, Coags + PSA to be drawn on procedure day   Thank you for this interesting consult.  I greatly enjoyed meeting Kellogg and look forward to participating in their care.  A copy of this report was sent to the requesting provider on this date.   Electronically Signed:  Thom Hall, MD Vascular and Interventional Radiology Specialists [facility] Radiology   Pager. [phone] Clinic. [phone]  I spent a total of 45 Minutes  in face to face in clinical consultation, greater than 50% of which was counseling/coordinating care for Mr Zachary Barton BPH and  urinary retention

## 2024-02-29 ENCOUNTER — Other Ambulatory Visit: Payer: Self-pay | Admitting: Interventional Radiology

## 2024-02-29 DIAGNOSIS — N401 Enlarged prostate with lower urinary tract symptoms: Secondary | ICD-10-CM

## 2024-03-05 ENCOUNTER — Ambulatory Visit
Admission: RE | Admit: 2024-03-05 | Discharge: 2024-03-05 | Disposition: A | Discharge status: Home / Self Care | Source: Ambulatory Visit | Attending: Interventional Radiology

## 2024-03-05 ENCOUNTER — Other Ambulatory Visit: Payer: Self-pay

## 2024-03-05 DIAGNOSIS — N401 Enlarged prostate with lower urinary tract symptoms: Secondary | ICD-10-CM

## 2024-03-05 MED ORDER — MINOXIDIL 10 MG PO TABS: 20.0000 mg | ORAL_TABLET | Freq: Every day | ORAL | 3 refills | Status: AC

## 2024-03-05 MED ORDER — IOPAMIDOL (ISOVUE-370) INJECTION 76%
80.0000 mL | Freq: Once | INTRAVENOUS | Status: AC | PRN
  Administered 2024-03-05: 80 mL via INTRAVENOUS

## 2024-03-05 NOTE — Telephone Encounter (Signed)
 Re sent Minoxidil  to pharmacy- Pt takes 10mg  2 tablets daily.

## 2024-03-07 ENCOUNTER — Other Ambulatory Visit (HOSPITAL_COMMUNITY): Payer: Self-pay | Admitting: Interventional Radiology

## 2024-03-07 DIAGNOSIS — N138 Other obstructive and reflux uropathy: Secondary | ICD-10-CM

## 2024-03-13 ENCOUNTER — Other Ambulatory Visit: Payer: Self-pay | Admitting: Cardiology

## 2024-03-13 ENCOUNTER — Encounter: Payer: Self-pay | Admitting: Cardiology

## 2024-03-14 ENCOUNTER — Other Ambulatory Visit: Payer: Self-pay

## 2024-03-14 MED ORDER — LOSARTAN POTASSIUM 100 MG PO TABS: 100.0000 mg | ORAL_TABLET | Freq: Every day | ORAL | 1 refills | Status: AC

## 2024-03-14 MED ORDER — ROSUVASTATIN CALCIUM 10 MG PO TABS: 10.0000 mg | ORAL_TABLET | Freq: Every day | ORAL | 1 refills | Status: AC

## 2024-03-19 ENCOUNTER — Ambulatory Visit (HOSPITAL_BASED_OUTPATIENT_CLINIC_OR_DEPARTMENT_OTHER)
Admission: RE | Admit: 2024-03-19 | Discharge: 2024-03-19 | Disposition: A | Discharge status: Home / Self Care | Source: Ambulatory Visit | Attending: Cardiology | Admitting: Cardiology

## 2024-03-19 DIAGNOSIS — R0609 Other forms of dyspnea: Secondary | ICD-10-CM | POA: Insufficient documentation

## 2024-03-19 LAB — ECHOCARDIOGRAM COMPLETE
AR max vel: 2.47 cm2
AV Area VTI: 2.52 cm2
AV Area mean vel: 2.52 cm2
AV Mean grad: 6.5 mmHg
AV Peak grad: 11.9 mmHg
Ao pk vel: 1.73 m/s
Area-P 1/2: 4.1 cm2
Calc EF: 69.6 %
MV M vel: 6.1 m/s
MV Peak grad: 148.8 mmHg
S' Lateral: 2.35 cm
Single Plane A2C EF: 66.9 %
Single Plane A4C EF: 68.8 %

## 2024-03-23 ENCOUNTER — Ambulatory Visit: Payer: Self-pay | Admitting: Cardiology

## 2024-03-27 ENCOUNTER — Other Ambulatory Visit: Payer: Self-pay | Admitting: Radiology

## 2024-03-27 DIAGNOSIS — N401 Enlarged prostate with lower urinary tract symptoms: Secondary | ICD-10-CM

## 2024-03-29 ENCOUNTER — Telehealth: Payer: Self-pay

## 2024-03-29 NOTE — H&P (Signed)
 Chief Complaint: Benign prostatic hyperplasia with lower urinary tract symptoms; referred for bilateral prostate artery embolization  Referring Provider(s): Pace,M  Supervising Physician: Hughes Simmonds  Patient Status: [facility]  History of Present Illness: Zachary Barton is a 78 y.o. male past medical history significant for hyperlipidemia, hypertension, osteoarthritis, prior SVT/palpitations, hematuria and BPH/LUTS.  He underwent consultation with Dr.Mugweru on 02/27/2024 to discuss treatment options for his BPH/LUTS and was deemed an appropriate candidate for bilateral prostate artery embolization.  He presents today for the procedure.  *** Patient is Full Code  Past Medical History:  Diagnosis Date   Dyslipidemia 07/09/2020   Dyspnea on exertion 05/28/2020   Essential hypertension 05/28/2020   Knee pain    Osteoarthritis of left patellofemoral joint    Palpitations    Supraventricular tachycardia (HCC) 07/09/2020    Past Surgical History:  Procedure Laterality Date   CATARACT EXTRACTION, BILATERAL  2years ago   HERNIA REPAIR  25 years ago   IR RADIOLOGIST EVAL & MGMT  02/27/2024    Allergies: Patient has no known allergies.  Medications: Prior to Admission medications   Medication Sig Start Date End Date Taking? Authorizing Provider  aspirin 81 MG EC tablet Take 81 mg by mouth every other day.    [provider]  Glucosamine Sulfate 1000 MG TABS Take 1,000 mg by mouth daily.    [provider]  losartan  (COZAAR ) 100 MG tablet Take 1 tablet (100 mg total) by mouth daily. TAKE 1 TABLET(100 MG) BY MOUTH DAILY 03/14/24   Krasowski, Robert J, MD  metoprolol  succinate (TOPROL -XL) 50 MG 24 hr tablet Take 1.5 tablets (75 mg total) by mouth daily. Take with or immediately following a meal. 02/22/24   Krasowski, Robert J, MD  minoxidil  (LONITEN ) 10 MG tablet Take 2 tablets (20 mg total) by mouth daily. 03/05/24   Krasowski, Robert J, MD  rosuvastatin   (CRESTOR ) 10 MG tablet Take 1 tablet (10 mg total) by mouth daily. 03/14/24   Krasowski, Robert J, MD  spironolactone  (ALDACTONE ) 25 MG tablet Take 0.5 tablets (12.5 mg total) by mouth daily. Stop the Hydrochlorothiazide  07/18/23 02/22/24  Bernie Lamar PARAS, MD     Family History  Problem Relation Age of Onset   Thyroid  disease Mother    Cancer Mother    Hypertension Mother    Cancer - Other Father    Diabetes Sister    Heart attack Brother    Heart attack Maternal Grandfather    Cancer Paternal Grandfather    Atrial fibrillation Brother    Cancer - Other Brother        kidney    Social History   Socioeconomic History   Marital status: Married    Spouse name: Not on file   Number of children: Not on file   Years of education: Not on file   Highest education level: Not on file  Occupational History   Not on file  Tobacco Use   Smoking status: Former   Smokeless tobacco: Never  Substance and Sexual Activity   Alcohol use: Never   Drug use: Never   Sexual activity: Not on file  Other Topics Concern   Not on file  Social History Narrative   Not on file   Social Drivers of Health   Financial Resource Strain: Not on file  Food Insecurity: Not on file  Transportation Needs: Not on file  Physical Activity: Not on file  Stress: Not on file  Social Connections: Not on  file       Review of Systems  Vital Signs:   Advance Care Plan: no documents on file  Physical Exam  Imaging: ECHOCARDIOGRAM COMPLETE Result Date: 03/19/2024    ECHOCARDIOGRAM REPORT   Patient Name:   Zachary Barton Date of Exam: 03/19/2024 Medical Rec #:  [id number]       Height:       70.0 in Accession #:    [id number]      Weight:       180.4 lb Date of Birth:  1946-05-28        BSA:          1.998 m Patient Age:    78 years        BP:           148/88 mmHg Patient Gender: M               HR:           62 bpm. Exam Location:  [place] Procedure: 2D Echo, 3D Echo, Cardiac Doppler, Color Doppler and  Strain Analysis            (Both Spectral and Color Flow Doppler were utilized during            procedure). Indications:    R06.9 DOE  History:        Patient has prior history of Echocardiogram examinations, most                 recent 06/16/2020. Arrythmias:Tachycardia,                 Signs/Symptoms:Dyspnea; Risk Factors:Hypertension, Dyslipidemia                 and Former Smoker.  Sonographer:    Alan Greenhouse RDMS, RVT, RDCS Referring Phys: [phone] LAMAR PARAS KRASOWSKI IMPRESSIONS  1. Left ventricular ejection fraction, by estimation, is 60 to 65%. The left ventricle has normal function. The left ventricle has no regional wall motion abnormalities. There is mild left ventricular hypertrophy. Left ventricular diastolic parameters were normal. The average left ventricular global longitudinal strain is -22.8 %. The global longitudinal strain is normal.  2. Right ventricular systolic function is normal. The right ventricular size is normal. There is normal pulmonary artery systolic pressure. The estimated right ventricular systolic pressure is 32.4 mmHg.  3. The mitral valve is grossly normal. Trivial mitral valve regurgitation. No evidence of mitral stenosis.  4. The aortic valve is tricuspid. There is mild thickening of the aortic valve. Aortic valve regurgitation is not visualized. No aortic stenosis is present.  5. The inferior vena cava is normal in size with greater than 50% respiratory variability, suggesting right atrial pressure of 3 mmHg. Comparison(s): Echocardiogram done 06/16/20 showed an EF of 60-65%. FINDINGS  Left Ventricle: Left ventricular ejection fraction, by estimation, is 60 to 65%. The left ventricle has normal function. The left ventricle has no regional wall motion abnormalities. The average left ventricular global longitudinal strain is -22.8 %. Strain was performed and the global longitudinal strain is normal. The left ventricular internal cavity size was normal in size. There is mild left  ventricular hypertrophy. Left ventricular diastolic parameters were normal. Right Ventricle: The right ventricular size is normal. Right vetricular wall thickness was not well visualized. Right ventricular systolic function is normal. There is normal pulmonary artery systolic pressure. The tricuspid regurgitant velocity is 2.71 m/s, and with an assumed right atrial pressure of 3 mmHg, the estimated right ventricular  systolic pressure is 32.4 mmHg. Left Atrium: Left atrial size was normal in size. Right Atrium: Right atrial size was normal in size. Pericardium: There is no evidence of pericardial effusion. Mitral Valve: The mitral valve is grossly normal. Trivial mitral valve regurgitation. No evidence of mitral valve stenosis. Tricuspid Valve: The tricuspid valve is grossly normal. Tricuspid valve regurgitation is mild . No evidence of tricuspid stenosis. Aortic Valve: The aortic valve is tricuspid. There is mild thickening of the aortic valve. Aortic valve regurgitation is not visualized. No aortic stenosis is present. Aortic valve mean gradient measures 6.5 mmHg. Aortic valve peak gradient measures 11.9  mmHg. Aortic valve area, by VTI measures 2.52 cm. Pulmonic Valve: The pulmonic valve was grossly normal. Pulmonic valve regurgitation is not visualized. No evidence of pulmonic stenosis. Aorta: The aortic root is normal in size and structure and the ascending aorta was not well visualized. Venous: The inferior vena cava is normal in size with greater than 50% respiratory variability, suggesting right atrial pressure of 3 mmHg. IAS/Shunts: The interatrial septum was not well visualized.  LEFT VENTRICLE PLAX 2D LVIDd:         4.80 cm     Diastology LVIDs:         2.35 cm     LV e' medial:    6.85 cm/s LV PW:         0.90 cm     LV E/e' medial:  15.2 LV IVS:        1.20 cm     LV e' lateral:   10.90 cm/s LVOT diam:     2.20 cm     LV E/e' lateral: 9.5 LV SV:         113 LV SV Index:   57          2D Longitudinal  Strain LVOT Area:     3.80 cm    2D Strain GLS (A4C):   -23.3 %                            2D Strain GLS (A3C):   -22.6 %                            2D Strain GLS (A2C):   -22.5 % LV Volumes (MOD)           2D Strain GLS Avg:     -22.8 % LV vol d, MOD A2C: 97.4 ml LV vol d, MOD A4C: 86.4 ml LV vol s, MOD A2C: 32.2 ml LV vol s, MOD A4C: 27.0 ml 3D Volume EF: LV SV MOD A2C:     65.2 ml 3D EF:        63 % LV SV MOD A4C:     86.4 ml LV EDV:       142 ml LV SV MOD BP:      68.4 ml LV ESV:       53 ml                            LV SV:        89 ml RIGHT VENTRICLE RV S prime:     14.00 cm/s TAPSE (M-mode): 2.3 cm LEFT ATRIUM             Index        RIGHT ATRIUM  Index LA diam:        3.20 cm 1.60 cm/m   RA Area:     17.80 cm LA Vol (A2C):   66.3 ml 33.19 ml/m  RA Volume:   55.10 ml  27.58 ml/m LA Vol (A4C):   58.8 ml 29.43 ml/m LA Biplane Vol: 64.5 ml 32.29 ml/m  AORTIC VALVE AV Area (Vmax):    2.47 cm AV Area (Vmean):   2.52 cm AV Area (VTI):     2.52 cm AV Vmax:           172.50 cm/s AV Vmean:          123.000 cm/s AV VTI:            0.448 m AV Peak Grad:      11.9 mmHg AV Mean Grad:      6.5 mmHg LVOT Vmax:         112.00 cm/s LVOT Vmean:        81.700 cm/s LVOT VTI:          0.297 m LVOT/AV VTI ratio: 0.66  AORTA Ao Root diam: 3.40 cm MITRAL VALVE                TRICUSPID VALVE MV Area (PHT): 4.10 cm     TR Peak grad:   29.4 mmHg MV Decel Time: 185 msec     TR Vmax:        271.00 cm/s MR Peak grad: 148.8 mmHg MR Vmax:      610.00 cm/s   SHUNTS MV E velocity: 104.00 cm/s  Systemic VTI:  0.30 m MV A velocity: 77.00 cm/s   Systemic Diam: 2.20 cm MV E/A ratio:  1.35 Sreedhar reddy Madireddy Electronically signed by Alean reddy Madireddy Signature Date/Time: 03/19/2024/2:07:23 PM    Final    CT ANGIO PELVIS W OR WO CONTRAST Result Date: 03/08/2024 CLINICAL DATA:  Procedure pre planning Dx: BPH with lower urinary tract symptoms without urinary obstruction EXAM: CT ANGIOGRAPHY PELVIS WITH CONTRAST  TECHNIQUE: Multidetector CT imaging of the pelvis was performed using the standard protocol following the bolus administration of intravenous contrast. Multiplanar image (3D post-processing) reconstructions and MIPs were obtained to evaluate the pelvic vascular anatomy. RADIATION DOSE REDUCTION: This exam was performed according to the departmental dose-optimization program which includes automated exposure control, adjustment of the mA and/or kV according to patient size and/or use of iterative reconstruction technique. CONTRAST:  80mL ISOVUE -370 IOPAMIDOL  (ISOVUE -370) INJECTION 76% COMPARISON:  CT AP, 10/30/2023. FINDINGS: VASCULAR; Pelvis: *Widely patent aortic bifurcation, hypogastric, external iliac and common femoral arteries. *Internal iliac artery branching with division into the superior gluteal artery and gluteal pudendal trunk bilaterally (Yamaki group A). 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 No discrete prostatic supply. NONVASCULAR; Urinary Tract:  No distal ureteral or urinary bladder distention Bowel: Imaged portions of distal small bowel, colon and rectum are nondilated. Lymphatic: No enlarged abdominal or pelvic lymph nodes. Reproductive: Prostatomegaly, measuring approximately 5.1 x 5.3 x 5.1 cm, with a calculated volume of 69 mL Other:  No pelvic ascites. Musculoskeletal: No suspicious bone lesions identified. IMPRESSION: 1. Prostatomegaly, measuring approximately 69 g in volume. 2. 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 Patent BILATERAL prostatic arteries, seemingly arising from the gluteal pudendal trunks bilaterally (type II). 4.  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 Thom Hall, MD Vascular and Interventional Radiology Specialists [facility] Radiology Electronically Signed   By: Thom Hall M.D.   On: 03/08/2024 13:59    Labs:  CBC: No results for input(s): WBC, HGB, HCT, PLT in the last 8760 hours.  COAGS: No results for input(s): INR, APTT in the last 8760 hours.  BMP: Recent Labs    08/04/23 0928  NA 143  K 4.4  CL 105  CO2 23  GLUCOSE 96  BUN 12  CALCIUM  9.2  CREATININE 1.05    LIVER FUNCTION TESTS: No results for input(s): BILITOT, AST, ALT, ALKPHOS, PROT, ALBUMIN in the last 8760 hours.  TUMOR MARKERS: No results for input(s): AFPTM, CEA, CA199, CHROMGRNA in the last 8760 hours.  Assessment and Plan: 78 y.o. male past medical history significant for hyperlipidemia, hypertension, osteoarthritis, prior SVT/palpitations, hematuria and BPH/LUTS.  He underwent consultation with Dr.Mugweru on 02/27/2024 to discuss treatment options for his BPH/LUTS and was deemed an appropriate candidate for bilateral prostate artery embolization.  He presents today for the procedure.Risks and benefits of procedure were discussed with the patient including, but not limited to bleeding, infection, vascular injury or contrast induced renal failure.  This interventional procedure involves the use of X-rays and because of the nature of the planned procedure, it is possible that we will have prolonged use of X-ray fluoroscopy.  Potential radiation risks to you include (but are not limited to) the following: - A slightly elevated risk for cancer  several years later in life. This risk is typically less than 0.5% percent. This risk is low in comparison to the normal incidence of human cancer, which is 33% for women and 50% for men according to the American Cancer Society. - Radiation induced injury can include skin redness, resembling a rash, tissue breakdown  / ulcers and hair loss (which can be temporary or permanent).   The likelihood of either of these occurring depends on the difficulty of the procedure and whether you are sensitive to radiation due to previous procedures, disease, or genetic conditions.   IF your procedure requires a prolonged use of radiation, you will be notified and given written instructions for further action.  It is your responsibility to monitor the irradiated area for the 2 weeks following the procedure and to notify your physician if you are concerned that you have suffered a radiation induced injury.    All of the patient's questions were answered, patient is agreeable to proceed.  Consent signed and in chart.      Thank you for allowing our service to participate in Zachary Barton 's care.  Electronically Signed: D. Franky Rakers, PA-C   03/29/2024, 10:47 AM      I spent a total of  25 minutes   in face to face in clinical consultation, greater than 50% of which was counseling/coordinating care for image guided pelvic arteriogram with bilateral prostate artery embolization

## 2024-03-29 NOTE — Telephone Encounter (Signed)
 Pt viewed Echo results on My Chart per Dr. Vanetta Shawl note. Routed to PCP.

## 2024-03-30 ENCOUNTER — Ambulatory Visit (HOSPITAL_COMMUNITY)
Admission: RE | Admit: 2024-03-30 | Discharge: 2024-03-30 | Disposition: A | Discharge status: Home / Self Care | Source: Ambulatory Visit | Attending: Interventional Radiology | Admitting: Interventional Radiology

## 2024-03-30 ENCOUNTER — Other Ambulatory Visit (HOSPITAL_COMMUNITY): Payer: Self-pay | Admitting: Interventional Radiology

## 2024-03-30 ENCOUNTER — Encounter (HOSPITAL_COMMUNITY): Payer: Self-pay

## 2024-03-30 ENCOUNTER — Other Ambulatory Visit: Payer: Self-pay

## 2024-03-30 ENCOUNTER — Other Ambulatory Visit (HOSPITAL_COMMUNITY): Payer: Self-pay

## 2024-03-30 DIAGNOSIS — N401 Enlarged prostate with lower urinary tract symptoms: Secondary | ICD-10-CM

## 2024-03-30 DIAGNOSIS — R319 Hematuria, unspecified: Secondary | ICD-10-CM | POA: Diagnosis not present

## 2024-03-30 DIAGNOSIS — Z87891 Personal history of nicotine dependence: Secondary | ICD-10-CM | POA: Diagnosis not present

## 2024-03-30 HISTORY — PX: IR US GUIDE VASC ACCESS LEFT: IMG2389

## 2024-03-30 HISTORY — PX: IR EMBO TUMOR ORGAN ISCHEMIA INFARCT INC GUIDE ROADMAPPING: IMG5449

## 2024-03-30 HISTORY — PX: IR 3D INDEPENDENT WKST: IMG2385

## 2024-03-30 HISTORY — PX: IR ANGIOGRAM PELVIS SELECTIVE OR SUPRASELECTIVE: IMG661

## 2024-03-30 HISTORY — PX: IR ANGIOGRAM SELECTIVE EACH ADDITIONAL VESSEL: IMG667

## 2024-03-30 HISTORY — PX: IR AORTA/THORACIC: IMG634

## 2024-03-30 LAB — CBC WITH DIFFERENTIAL/PLATELET
Abs Immature Granulocytes: 0.01 K/uL (ref 0.00–0.07)
Basophils Absolute: 0 K/uL (ref 0.0–0.1)
Basophils Relative: 0 %
Eosinophils Absolute: 0.1 K/uL (ref 0.0–0.5)
Eosinophils Relative: 2 %
HCT: 40.1 % (ref 39.0–52.0)
Hemoglobin: 13.5 g/dL (ref 13.0–17.0)
Immature Granulocytes: 0 %
Lymphocytes Relative: 28 %
Lymphs Abs: 1.4 K/uL (ref 0.7–4.0)
MCH: 30.2 pg (ref 26.0–34.0)
MCHC: 33.7 g/dL (ref 30.0–36.0)
MCV: 89.7 fL (ref 80.0–100.0)
Monocytes Absolute: 0.6 K/uL (ref 0.1–1.0)
Monocytes Relative: 12 %
Neutro Abs: 2.9 K/uL (ref 1.7–7.7)
Neutrophils Relative %: 58 %
Platelets: 154 K/uL (ref 150–400)
RBC: 4.47 MIL/uL (ref 4.22–5.81)
RDW: 12.6 % (ref 11.5–15.5)
WBC: 4.9 K/uL (ref 4.0–10.5)
nRBC: 0 % (ref 0.0–0.2)

## 2024-03-30 LAB — BASIC METABOLIC PANEL WITH GFR
Anion gap: 11 (ref 5–15)
BUN: 12 mg/dL (ref 8–23)
CO2: 25 mmol/L (ref 22–32)
Calcium: 9.4 mg/dL (ref 8.9–10.3)
Chloride: 106 mmol/L (ref 98–111)
Creatinine, Ser: 0.91 mg/dL (ref 0.61–1.24)
GFR, Estimated: >60 mL/min (ref >60)
Glucose, Bld: 105 mg/dL — ABNORMAL HIGH (ref 70–99)
Potassium: 4.1 mmol/L (ref 3.5–5.1)
Sodium: 142 mmol/L (ref 135–145)

## 2024-03-30 LAB — PROTIME-INR
INR: 1 (ref 0.8–1.2)
Prothrombin Time: 13.7 s (ref 11.4–15.2)

## 2024-03-30 LAB — PSA: Prostatic Specific Antigen: 2.88 ng/mL (ref 0.00–4.00)

## 2024-03-30 MED ORDER — MIDAZOLAM HCL 2 MG/2ML IJ SOLN
INTRAMUSCULAR | Status: AC
  Filled 2024-03-30: qty 2

## 2024-03-30 MED ORDER — DEXAMETHASONE SODIUM PHOSPHATE 10 MG/ML IJ SOLN
INTRAMUSCULAR | Status: AC
  Filled 2024-03-30: qty 1

## 2024-03-30 MED ORDER — SODIUM CHLORIDE 0.9 % IV SOLN
INTRAVENOUS | Status: AC | PRN
  Administered 2024-03-30: 2 g via INTRAVENOUS

## 2024-03-30 MED ORDER — VERAPAMIL HCL 2.5 MG/ML IV SOLN
INTRAVENOUS | Status: AC | PRN
  Administered 2024-03-30: 2.5 mg via INTRA_ARTERIAL

## 2024-03-30 MED ORDER — SODIUM CHLORIDE 0.9 % IV SOLN: INTRAVENOUS | Status: DC

## 2024-03-30 MED ORDER — CHLORHEXIDINE GLUCONATE CLOTH 2 % EX PADS: 6.0000 | MEDICATED_PAD | Freq: Every day | CUTANEOUS | Status: DC

## 2024-03-30 MED ORDER — NITROGLYCERIN 1 MG/10 ML FOR IR/CATH LAB
INTRA_ARTERIAL | Status: AC | PRN
  Administered 2024-03-30: 200 ug via INTRA_ARTERIAL

## 2024-03-30 MED ORDER — SODIUM CHLORIDE 0.9 % IV SOLN
2.0000 g | Freq: Once | INTRAVENOUS | Status: AC
Start: 2024-03-30 — End: 2024-03-30

## 2024-03-30 MED ORDER — OXYCODONE HCL 5 MG PO TABS: 10.0000 mg | ORAL_TABLET | ORAL | Status: DC | PRN

## 2024-03-30 MED ORDER — CEFTRIAXONE SODIUM 2 G IJ SOLR
INTRAMUSCULAR | Status: AC
  Filled 2024-03-30: qty 20

## 2024-03-30 MED ORDER — LIDOCAINE HCL (PF) 1 % IJ SOLN
INTRAMUSCULAR | Status: AC
  Filled 2024-03-30: qty 30

## 2024-03-30 MED ORDER — OXYCODONE-ACETAMINOPHEN 5-325 MG PO TABS
1.0000 | ORAL_TABLET | Freq: Four times a day (QID) | ORAL | 0 refills | Status: AC | PRN
  Filled 2024-03-30: qty 20, 5d supply, fill #0

## 2024-03-30 MED ORDER — FREE WATER: 500.0000 mL | Freq: Once | Status: DC

## 2024-03-30 MED ORDER — SOLIFENACIN SUCCINATE 5 MG PO TABS
5.0000 mg | ORAL_TABLET | Freq: Every day | ORAL | 0 refills | Status: AC
  Filled 2024-03-30: qty 7, 7d supply, fill #0

## 2024-03-30 MED ORDER — HEPARIN SODIUM (PORCINE) 1000 UNIT/ML IJ SOLN
INTRAMUSCULAR | Status: AC | PRN
  Administered 2024-03-30: 3000 [IU] via INTRA_ARTERIAL

## 2024-03-30 MED ORDER — IOHEXOL 300 MG/ML  SOLN
100.0000 mL | Freq: Once | INTRAMUSCULAR | Status: AC | PRN
  Administered 2024-03-30: 70 mL via INTRA_ARTERIAL

## 2024-03-30 MED ORDER — VERAPAMIL HCL 2.5 MG/ML IV SOLN
INTRAVENOUS | Status: AC
  Filled 2024-03-30: qty 2

## 2024-03-30 MED ORDER — NAPROXEN 500 MG PO TABS
500.0000 mg | ORAL_TABLET | Freq: Two times a day (BID) | ORAL | 0 refills | Status: AC
  Filled 2024-03-30: qty 14, 7d supply, fill #0

## 2024-03-30 MED ORDER — DOCUSATE SODIUM 100 MG PO CAPS
100.0000 mg | ORAL_CAPSULE | Freq: Two times a day (BID) | ORAL | 0 refills | Status: AC
  Filled 2024-03-30: qty 10, 5d supply, fill #0

## 2024-03-30 MED ORDER — LIDOCAINE HCL (PF) 1 % IJ SOLN
30.0000 mL | Freq: Once | INTRAMUSCULAR | Status: AC
  Administered 2024-03-30: 1 mL via INTRADERMAL

## 2024-03-30 MED ORDER — MIDAZOLAM HCL 2 MG/2ML IJ SOLN
INTRAMUSCULAR | Status: AC | PRN
  Administered 2024-03-30 (×5): .5 mg via INTRAVENOUS
  Administered 2024-03-30: 1 mg via INTRAVENOUS
  Administered 2024-03-30: .5 mg via INTRAVENOUS

## 2024-03-30 MED ORDER — IOHEXOL 300 MG/ML  SOLN
100.0000 mL | Freq: Once | INTRAMUSCULAR | Status: AC | PRN
  Administered 2024-03-30: 30 mL via INTRA_ARTERIAL

## 2024-03-30 MED ORDER — SODIUM CHLORIDE 0.9 % IV SOLN: 250.0000 mL | INTRAVENOUS | Status: DC | PRN

## 2024-03-30 MED ORDER — FENTANYL CITRATE (PF) 100 MCG/2ML IJ SOLN
INTRAMUSCULAR | Status: AC | PRN
  Administered 2024-03-30: 50 ug via INTRAVENOUS
  Administered 2024-03-30 (×2): 25 ug via INTRAVENOUS
  Administered 2024-03-30: 50 ug via INTRAVENOUS
  Administered 2024-03-30 (×2): 25 ug via INTRAVENOUS

## 2024-03-30 MED ORDER — FENTANYL CITRATE (PF) 100 MCG/2ML IJ SOLN
INTRAMUSCULAR | Status: AC
  Filled 2024-03-30: qty 2

## 2024-03-30 MED ORDER — PHENAZOPYRIDINE HCL 100 MG PO TABS
100.0000 mg | ORAL_TABLET | Freq: Three times a day (TID) | ORAL | 0 refills | Status: AC | PRN
  Filled 2024-03-30: qty 21, 7d supply, fill #0

## 2024-03-30 MED ORDER — NITROGLYCERIN IN D5W 100-5 MCG/ML-% IV SOLN
INTRAVENOUS | Status: AC
  Filled 2024-03-30: qty 250

## 2024-03-30 MED ORDER — METHYLPREDNISOLONE 4 MG PO TBPK
ORAL_TABLET | ORAL | 0 refills | Status: AC
  Filled 2024-03-30: qty 21, 6d supply, fill #0

## 2024-03-30 MED ORDER — HEPARIN SODIUM (PORCINE) 1000 UNIT/ML IJ SOLN
INTRAMUSCULAR | Status: AC
  Filled 2024-03-30: qty 10

## 2024-03-30 MED ORDER — CIPROFLOXACIN HCL 500 MG PO TABS
500.0000 mg | ORAL_TABLET | Freq: Two times a day (BID) | ORAL | 0 refills | Status: AC
  Filled 2024-03-30: qty 14, 7d supply, fill #0

## 2024-03-30 MED ORDER — IOHEXOL 300 MG/ML  SOLN
50.0000 mL | Freq: Once | INTRAMUSCULAR | Status: AC | PRN
  Administered 2024-03-30: 9 mL via INTRA_ARTERIAL

## 2024-03-30 MED ORDER — ONDANSETRON HCL 4 MG/2ML IJ SOLN: 4.0000 mg | Freq: Four times a day (QID) | INTRAMUSCULAR | Status: DC | PRN

## 2024-03-30 MED ORDER — DEXAMETHASONE SODIUM PHOSPHATE 10 MG/ML IJ SOLN
8.0000 mg | Freq: Once | INTRAMUSCULAR | Status: AC
  Administered 2024-03-30: 8 mg via INTRAVENOUS

## 2024-03-30 NOTE — Procedures (Signed)
 Vascular and Interventional Radiology Procedure Note  Patient: Zachary Barton DOB: May 13, 1946 Medical Record Number: [id number] Note Date/Time: 03/30/24 10:35 AM   Performing Physician: Thom Hall, MD Assistant(s): None  Diagnosis: BPH w LUTS   Procedure(s):  PELVIC ARTERIOGRAPHY PROSTATE ARTERY EMBOLIZATION   Anesthesia: Conscious Sedation Complications: None Estimated Blood Loss: Minimal Specimens: None  Findings:  - access via the LEFT radial artery. - tortuous prostatic arteries. - Successful embolization with 100-300 um and 300-500 um microparticles to near stasis, with additional coil embolization of the prostatic artery origins. - TR band closure at L wrist at the end of the case  Plan: - Post sheath removal precautions.  - Standard post radial access deflation protocol.   Final report to follow once all images are reviewed and compared with previous studies.  See detailed dictation with images in PACS. The patient tolerated the procedure well without incident or complication and was returned to Recovery in stable condition.    Thom Hall, MD Vascular and Interventional Radiology Specialists [facility] Radiology   Pager. [phone] Clinic. [phone]

## 2024-03-30 NOTE — Discharge Instructions (Addendum)
 For questions /concerns may call Interventional Radiology at [phone] or  Interventional Radiology clinic [phone]   You may remove your dressing and shower tomorrow afternoon                                   Moderate Conscious Sedation, Adult, Care After After the procedure, it is common to have: Sleepiness for a few hours. Impaired judgment for a few hours. Trouble with balance. Nausea or vomiting if you eat too soon. Follow these instructions at home: For the time period you were told by your health care provider:  Rest. Do not participate in activities where you could fall or become injured. Do not drive or use machinery. Do not drink alcohol. Do not take sleeping pills or medicines that cause drowsiness. Do not make important decisions or sign legal documents. Do not take care of children on your own. Eating and drinking Follow instructions from your health care provider about what you may eat and drink. Drink enough fluid to keep your urine pale yellow. If you vomit: Drink clear fluids slowly and in small amounts as you are able. Clear fluids include water , ice chips, low-calorie sports drinks, and fruit juice that has water  added to it (diluted fruit juice). Eat light and bland foods in small amounts as you are able. These foods include bananas, applesauce, rice, lean meats, toast, and crackers. General instructions Take over-the-counter and prescription medicines only as told by your health care provider. Have a responsible adult stay with you for the time you are told. Do not use any products that contain nicotine or tobacco. These products include cigarettes, chewing tobacco, and vaping devices, such as e-cigarettes. If you need help quitting, ask your health care provider. Return to your normal activities as told by your health care provider. Ask your health care provider what activities are safe for you. Your health care provider may give you more instructions.  Make sure you know what you can and cannot do. Contact a health care provider if: You are still sleepy or having trouble with balance after 24 hours. You feel light-headed. You vomit every time you eat or drink. You get a rash. You have a fever. You have redness or swelling around the IV site. Get help right away if: You have trouble breathing. You start to feel confused at home. These symptoms may be an emergency. Get help right away. Call 911. Do not wait to see if the symptoms will go away. Do not drive yourself to the hospital. This information is not intended to replace advice given to you by your health care provider. Make sure you discuss any questions you have with your health care provider. Document Revised: 01/11/2022 Document Reviewed: 01/11/2022 Elsevier Patient Education  2024 [employer]  INTERVENTIONAL RADIOLOGY PROSTATE ARETERY EMBOLIZATION (PAE)   Prostate Artery Embolization (PAE) is done to improve symptoms of benign prostatic hyperplasia (BPH) PAE is performed through a small catheter inserted into an artery in the wrist or groin. The catheter is then inserted into the vessels that supply blood to the prostate. Tiny microspheres are injected into the blood vessels that feed the prostate to reduce its blood supply, causing the prostate to shrink and improve symptoms- usually within days of the procedure.  PRE-PROCEDURE IV- 18g (20g if necessary) IV placed on R SIDE ONLY Fluids- 0.9% sodium chloride  (NS) Check bilateral pedal pulses and document Place Foley Catheter (size  16 FR) Give patient ordered pre-op medications POST-PROCEDURE  Patient may be admitted, or may return to short stay If patients are discharged home- patient must be able to void before D/C           Femoral Site Care This sheet gives you information about how to care for yourself after your procedure. Your health care provider may also give you more specific instructions. If  you have problems or questions, contact your health care provider. What can I expect after the procedure? After the procedure, it is common to have: Bruising that usually fades within 1-2 weeks. Tenderness at the site. Follow these instructions at home: Wound care Follow instructions from your health care provider about how to take care of your insertion site. Make sure you: Wash your hands with soap and water  before you change your bandage (dressing). If soap and water  are not available, use hand sanitizer. Change your dressing as told by your health care provider. Leave stitches (sutures), skin glue, or adhesive strips in place. These skin closures may need to stay in place for 2 weeks or longer. If adhesive strip edges start to loosen and curl up, you may trim the loose edges. Do not remove adhesive strips completely unless your health care provider tells you to do that. Do not take baths, swim, or use a hot tub until your health care provider approves. You may shower 24-48 hours after the procedure or as told by your health care provider. Gently wash the site with plain soap and water . Pat the area dry with a clean towel. Do not rub the site. This may cause bleeding. Do not apply powder or lotion to the site. Keep the site clean and dry. Check your femoral site every day for signs of infection. Check for: Redness, swelling, or pain. Fluid or blood. Warmth. Pus or a bad smell. Activity For the first 2-3 days after your procedure, or as long as directed: Avoid climbing stairs as much as possible. Do not squat. Do not lift anything that is heavier than 10 lb (4.5 kg), or the limit that you are told, until your health care provider says that it is safe. Rest as directed. Avoid sitting for a long time without moving. Get up to take short walks every 1-2 hours. Do not drive for 24 hours if you were given a medicine to help you relax (sedative). General instructions Take over-the-counter  and prescription medicines only as told by your health care provider. Keep all follow-up visits as told by your health care provider. This is important. Contact a health care provider if you have: A fever or chills. You have redness, swelling, or pain around your insertion site. Get help right away if: The catheter insertion area swells very fast. You pass out. You suddenly start to sweat or your skin gets clammy. The catheter insertion area is bleeding, and the bleeding does not stop when you hold steady pressure on the area. The area near or just beyond the catheter insertion site becomes pale, cool, tingly, or numb. These symptoms may represent a serious problem that is an emergency. Do not wait to see if the symptoms will go away. Get medical help right away. Call your local emergency services (911 in the [place] Do not drive yourself to the hospital. Summary After the procedure, it is common to have bruising that usually fades within 1-2 weeks. Check your femoral site every day for signs of infection. Do not lift anything that is heavier  than 10 lb (4.5 kg), or the limit that you are told, until your health care provider says that it is safe. This information is not intended to replace advice given to you by your health care provider. Make sure you discuss any questions you have with your health care provider. Document Revised: 07/11/2017 Document Reviewed: 07/11/2017 Elsevier Patient Education  2020 [employer].  (Embolization): What to Know After After having the blood flow through your artery or vein stopped, also called embolization, it's common to have pain or bruising where your catheter was put in. This is called your insertion site or puncture site. Other symptoms may depend on the part of your body that was treated. For example: Embolization of the arteries in your brain may cause a headache. Embolization of the arteries in your belly may cause you to: Lose your  appetite. Feel like you may throw up. Females who have embolization of fibroid tumors in the uterus may have pain or cramps. Follow these instructions at home: Medicines Take your medicines only as told. You may need to take steps to help treat or prevent trouble pooping (constipation), such as: Taking medicines to help you poop. Eating foods high in fiber, like beans, whole grains, and fresh fruits and vegetables. Drinking more fluids as told. Ask your health care provider if it's safe to drive or use machines while taking your medicine. Insertion site care  If your insertion site starts bleeding, lie down flat and put pressure on it. If the bleeding doesn't stop, get help right away. This is an emergency. Take care of your insertion site as told. Make sure you: Wash your hands with soap and water  for at least 20 seconds before and after you change your bandage. If you can't use soap and water , use hand sanitizer. Change your bandage. Leave stitches or skin glue alone. Leave tape strips alone unless you're told to take them off. You may trim the edges of the tape strips if they curl up. Keep the insertion site clean. Gently wash the site with plain soap and water . Pat the area dry with a clean towel. Do not rub the site. Rubbing may cause bleeding. Check the area around your insertion site every day for signs of infection. Check for: Redness, swelling, or pain. Fluid or blood. Warmth. Pus or a bad smell. Do not take baths, swim, or use a hot tub until you're told it's OK. Ask if you can shower. Activity Ask if it's OK for you to lift. Do not lift anything until your provider tells you it's OK. If you were given a sedative, do not drive or use machines until you're told it's safe. A sedative can make you sleepy. Ask what things are safe for you to do at home. Ask when you can go back to work or school. General instructions Do not smoke, vape, or use nicotine or tobacco. Doing this  can slow down healing. Wear compression stockings to reduce swelling and help prevent blood clots in your legs. Keep all follow-up visits. Your provider will make sure you're healing and that the treatment worked. Contact a health care provider if: You have pain that gets worse or doesn't get better with medicine. You have signs of infection at your insertion site. You have a fever. You feel like you may throw up. You throw up. You have a rash. Get help right away if: The insertion area swells very fast. The insertion site is bleeding, and the bleeding doesn't stop after  you hold pressure on it. The area near or just past the insertion site becomes pale, cool, tingly, or numb. You faint or feel like you may faint. You have chest pain. You have trouble breathing. This information is not intended to replace advice given to you by your health care provider. Make sure you discuss any questions you have with your health care provider. Document Revised: 09/26/2023 Document Reviewed: 09/26/2023 Elsevier Patient Education  2025 [employer].

## 2024-04-02 ENCOUNTER — Other Ambulatory Visit (HOSPITAL_COMMUNITY): Payer: Self-pay | Admitting: Interventional Radiology

## 2024-04-02 ENCOUNTER — Encounter (HOSPITAL_COMMUNITY): Payer: Self-pay | Admitting: Radiology

## 2024-04-02 DIAGNOSIS — N401 Enlarged prostate with lower urinary tract symptoms: Secondary | ICD-10-CM

## 2024-04-10 ENCOUNTER — Other Ambulatory Visit: Payer: Self-pay | Admitting: Cardiology

## 2024-04-10 ENCOUNTER — Encounter: Payer: Self-pay | Admitting: Cardiology

## 2024-04-10 NOTE — Telephone Encounter (Signed)
 Spironolactone  25mg  1/2 tablet daily #45 ref x 3

## 2024-05-08 NOTE — Progress Notes (Signed)
 Reason for visit: BPH with LUTS. Post prostate embolization.   Care Team(s) Primary Care: Patient, No Pcp Per Cardiology: Bernie Lamar PARAS, MD Urology: Elisabeth Valli BIRCH MD   History of Present Illness:   Mr. Zachary Barton is a 78 y.o. male w PMHx of SVT and HTN. Pt reports Hx of gross hematuria and BPH w moderate LUTS. He is on a diuretic and endorses frequent urination. He denies prior catherization or nocturia. He was seen by urology and underwent cyystoscopy which demonstrated a hypervascular enlarged prostate. No alpha blockade. He was referred for evaluation for potential embolization.  He underwent prostate artery embolization on 03/30/24 and noticed expected swelling but denied much discomfort after the procedure. He has noticed that his urinary symptoms have improved.   I used a International Prostatism Symptom Score* (IPSS) Score to quantify his symptoms: 4 point, previously 23 (pre PAE)   *Wadie MJ, Ashley GARNELL Mickey Valorie MP, et al. The American Urological Association symptom index for benign prostatic hyperplasia. The Measurement Committee of the American Urological Association. J Urol [id number]; 851:8450.   Review of Systems: A 12 point ROS discussed and pertinent positives are indicated in the HPI above.  All other systems are negative  Past Medical History:  Diagnosis Date   Dyslipidemia 07/09/2020   Dyspnea on exertion 05/28/2020   Essential hypertension 05/28/2020   Knee pain    Osteoarthritis of left patellofemoral joint    Palpitations    Supraventricular tachycardia 07/09/2020    Past Surgical History:  Procedure Laterality Date   CATARACT EXTRACTION, BILATERAL  2years ago   HERNIA REPAIR  25 years ago   IR 3D INDEPENDENT WKST  03/30/2024   IR 3D INDEPENDENT WKST  03/30/2024   IR ANGIOGRAM PELVIS SELECTIVE OR SUPRASELECTIVE  03/30/2024   IR ANGIOGRAM SELECTIVE EACH ADDITIONAL VESSEL  03/30/2024   IR ANGIOGRAM SELECTIVE EACH ADDITIONAL VESSEL  03/30/2024   IR  AORTA/THORACIC  03/30/2024   IR EMBO TUMOR ORGAN ISCHEMIA INFARCT INC GUIDE ROADMAPPING  03/30/2024   IR RADIOLOGIST EVAL & MGMT  02/27/2024   IR RADIOLOGIST EVAL & MGMT  05/09/2024   IR US  GUIDE VASC ACCESS LEFT  03/30/2024   IR US  GUIDE VASC ACCESS LEFT  03/30/2024   IR US  GUIDE VASC ACCESS LEFT  03/30/2024    Allergies: Patient has no known allergies.  Medications: Prior to Admission medications   Medication Sig Start Date End Date Taking? Authorizing Provider  aspirin 81 MG EC tablet Take 81 mg by mouth every other day.    [provider]  ciprofloxacin  (CIPRO ) 500 MG tablet Take 1 tablet (500 mg total) by mouth 2 (two) times daily. 03/30/24   Allred, Darrell K, PA-C  docusate sodium  (COLACE) 100 MG capsule Take 1 capsule (100 mg total) by mouth 2 (two) times daily. 03/30/24   Allred, Darrell K, PA-C  Glucosamine Sulfate 1000 MG TABS Take 1,000 mg by mouth daily.    [provider]  losartan  (COZAAR ) 100 MG tablet Take 1 tablet (100 mg total) by mouth daily. TAKE 1 TABLET(100 MG) BY MOUTH DAILY 03/14/24   Krasowski, Robert J, MD  metoprolol  succinate (TOPROL -XL) 50 MG 24 hr tablet Take 1.5 tablets (75 mg total) by mouth daily. Take with or immediately following a meal. 02/22/24   Krasowski, Robert J, MD  minoxidil  (LONITEN ) 10 MG tablet Take 2 tablets (20 mg total) by mouth daily. 03/05/24   Krasowski, Robert J, MD  naproxen  (NAPROSYN ) 500 MG tablet Take 1 tablet (  500 mg total) by mouth 2 (two) times daily with a meal. 03/30/24   Allred, Darrell K, PA-C  oxyCODONE -acetaminophen  (PERCOCET) 5-325 MG tablet Take 1 tablet by mouth every 6 (six) hours as needed for severe pain (pain score 7-10). 03/30/24   Allred, Darrell K, PA-C  phenazopyridine  (PYRIDIUM ) 100 MG tablet Take 1 tablet (100 mg total) by mouth 3 (three) times daily as needed. 03/30/24   Allred, Darrell K, PA-C  rosuvastatin  (CRESTOR ) 10 MG tablet Take 1 tablet (10 mg total) by mouth daily. 03/14/24   Krasowski, Robert J, MD   solifenacin  (VESICARE ) 5 MG tablet Take 1 tablet (5 mg total) by mouth daily. 03/30/24   Allred, Darrell K, PA-C  spironolactone  (ALDACTONE ) 25 MG tablet Take 0.5 tablets (12.5 mg total) by mouth daily. 04/10/24 07/09/24  Bernie Lamar PARAS, MD  Turmeric 500 MG CAPS Take 1 capsule by mouth daily at 6 (six) AM.    [provider]     Family History  Problem Relation Age of Onset   Thyroid  disease Mother    Cancer Mother    Hypertension Mother    Cancer - Other Father    Diabetes Sister    Heart attack Brother    Heart attack Maternal Grandfather    Cancer Paternal Grandfather    Atrial fibrillation Brother    Cancer - Other Brother        kidney    Social History   Socioeconomic History   Marital status: Married    Spouse name: Not on file   Number of children: Not on file   Years of education: Not on file   Highest education level: Not on file  Occupational History   Not on file  Tobacco Use   Smoking status: Former   Smokeless tobacco: Never  Substance and Sexual Activity   Alcohol use: Never   Drug use: Never   Sexual activity: Not on file  Other Topics Concern   Not on file  Social History Narrative   Not on file   Social Drivers of Health   Financial Resource Strain: Not on file  Food Insecurity: Not on file  Transportation Needs: Not on file  Physical Activity: Not on file  Stress: Not on file  Social Connections: Not on file     Vital Signs: BP (!) 152/80 (BP Location: Left Arm, Patient Position: Sitting, Cuff Size: Normal)   Pulse (!) 59   Temp 98.1 F (36.7 C) (Oral)   Resp 18   SpO2 97%   Physical Exam  General: WN, NAD  CV: RRR on monitor Pulm: normal work of breathing on RA Abd: S, ND, NT MSK: Grossly normal Psych: Appropriate affect.  Imaging:    IR PAE, 03/30/24 IMPRESSION:  Successful BILATERAL prostatic artery microparticle embolization, with additional coil embolization prostatic artery origins, via transradial  approach.   No results found.  Labs:  CBC: Recent Labs    03/30/24 0812  WBC 4.9  HGB 13.5  HCT 40.1  PLT 154    COAGS: Recent Labs    03/30/24 0812  INR 1.0    BMP: Recent Labs    08/04/23 0928 03/30/24 0812  NA 143 142  K 4.4 4.1  CL 105 106  CO2 23 25  GLUCOSE 96 105*  BUN 12 12  CALCIUM  9.2 9.4  CREATININE 1.05 0.91  GFRNONAA  --  >60     Latest Reference Range & Units 03/30/24 08:12  Prostatic Specific Antigen 0.00 -  4.00 ng/mL 2.88    Assessment and Plan:  78 y/o M w PMHx of SVT, HTN and BPH w moderate LUTS. Episodic gross hematuria w hypervascular, enlarged prostate on cystoscopy. No hematuria since. Residual nocturia 3-4X/night, decreased from peak >5X   Prostatomegaly (~99 mL) PSA, last 3.1 (11/28/23) IPSS Score Total, 4 Pts, pre PAE 23 Pts QoL, markedly improved. No alpha blockade.    *Pt is very happy with the prostate embolization procedural result and his current urinary state.  *No concern at this time. Follow up with [facility] PRN. *Continue routine Urological follow up.  Electronically Signed:  Thom Hall, MD [facility]    Pager. [phone] Clinic. [phone]   I spent a total of 30 Minutes  in face to face in clinical consultation, greater than 50% of which was counseling/coordinating care for Mr Zachary Barton BPH and urinary retention

## 2024-05-09 ENCOUNTER — Ambulatory Visit
Admission: RE | Admit: 2024-05-09 | Discharge: 2024-05-09 | Disposition: A | Discharge status: Home / Self Care | Source: Ambulatory Visit | Attending: Radiology | Admitting: Radiology

## 2024-05-09 DIAGNOSIS — N401 Enlarged prostate with lower urinary tract symptoms: Secondary | ICD-10-CM

## 2024-05-09 HISTORY — PX: IR RADIOLOGIST EVAL & MGMT: IMG5224

## 2024-09-06 ENCOUNTER — Ambulatory Visit: Admitting: Cardiology
